# Patient Record
Sex: Female | Born: 1950 | Race: White | Hispanic: No | Marital: Single | Smoking: Never smoker
Health system: Southern US, Community
[De-identification: ages and names within clinical notes are randomized; demographics above are authoritative.]

## PROBLEM LIST (undated history)

## (undated) DIAGNOSIS — C50511 Malignant neoplasm of lower-outer quadrant of right female breast: Secondary | ICD-10-CM

## (undated) DIAGNOSIS — T8859XA Other complications of anesthesia, initial encounter: Secondary | ICD-10-CM

## (undated) DIAGNOSIS — M1990 Unspecified osteoarthritis, unspecified site: Secondary | ICD-10-CM

## (undated) DIAGNOSIS — T4145XA Adverse effect of unspecified anesthetic, initial encounter: Secondary | ICD-10-CM

## (undated) DIAGNOSIS — I10 Essential (primary) hypertension: Secondary | ICD-10-CM

## (undated) DIAGNOSIS — C801 Malignant (primary) neoplasm, unspecified: Secondary | ICD-10-CM

## (undated) HISTORY — PX: MASTECTOMY: SHX3

## (undated) HISTORY — PX: TONSILLECTOMY: SUR1361

## (undated) HISTORY — PX: CATARACT EXTRACTION: SUR2

## (undated) HISTORY — PX: EYE SURGERY: SHX253

## (undated) HISTORY — PX: HERNIA REPAIR: SHX51

## (undated) HISTORY — PX: FRACTURE SURGERY: SHX138

## (undated) HISTORY — PX: CHOLECYSTECTOMY: SHX55

---
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
## 2000-05-11 HISTORY — PX: BREAST BIOPSY: SHX20

## 2018-11-16 ENCOUNTER — Other Ambulatory Visit (HOSPITAL_COMMUNITY): Payer: Self-pay | Admitting: Surgery

## 2018-11-16 ENCOUNTER — Other Ambulatory Visit: Payer: Self-pay | Admitting: Surgery

## 2018-11-16 DIAGNOSIS — R1906 Epigastric swelling, mass or lump: Secondary | ICD-10-CM

## 2018-11-23 ENCOUNTER — Other Ambulatory Visit: Payer: Self-pay

## 2018-11-23 ENCOUNTER — Ambulatory Visit
Admission: RE | Admit: 2018-11-23 | Discharge: 2018-11-23 | Disposition: A | Discharge status: Home / Self Care | Payer: Medicare Other | Source: Ambulatory Visit | Attending: Surgery | Admitting: Surgery

## 2018-11-23 DIAGNOSIS — R1906 Epigastric swelling, mass or lump: Secondary | ICD-10-CM

## 2018-11-23 MED ORDER — IOHEXOL 300 MG/ML  SOLN
100.0000 mL | Freq: Once | INTRAMUSCULAR | Status: AC | PRN
  Administered 2018-11-23: 100 mL via INTRAVENOUS

## 2018-12-01 ENCOUNTER — Ambulatory Visit: Payer: Self-pay | Admitting: Surgery

## 2018-12-01 NOTE — H&P (Addendum)
Subjective:   CC: Epigastric mass [R19.06]  HPI:  Loretta Wong is a 68 y.o. female who was referred by James F Hedrick, MD for evaluation of above. First noticed it 1 year ago. Asymptomatic, but increasing in size.  Started as golf ball size, and now the size of football.  She thinks it fluctuates in size, but overall increasing in size.  Lump is not reducible. Patient has no symptoms of  difficulty urinating.    Past Medical History:  has no past medical history on file.  Past Surgical History:       Past Surgical History:  Procedure Laterality Date  . arm surgery    . CHOLECYSTECTOMY    . TONSILLECTOMY      Family History: family history includes Breast cancer in her mother; Colon cancer in her sister; High blood pressure (Hypertension) in her brother; Stomach cancer in her maternal grandfather.  Social History:  reports that she has never smoked. She has never used smokeless tobacco. She reports current alcohol use of about 1.0 standard drinks of alcohol per week. She reports that she does not use drugs.  Current Medications: currently has no medications in their medication list.  Allergies:       Allergies as of 11/16/2018 - Reviewed 11/16/2018  Allergen Reaction Noted  . Neomycin Rash 11/16/2018  . Sulfa (sulfonamide antibiotics) Vomiting 11/16/2018    ROS:  A 15 point review of systems was performed and pertinent positives and negatives noted in HPI   Objective:   BP (!) 181/88   Pulse 71   Ht 167.6 cm (5' 6")   Wt (!) 101.6 kg (224 lb)   BMI 36.15 kg/m   Constitutional :  alert, appears stated age, cooperative and no distress  Lymphatics/Throat:  no asymmetry, masses, or scars  Respiratory:  clear to auscultation bilaterally  Cardiovascular:  regular rate and rhythm  Gastrointestinal: soft, no guarding, non-tender.  Large right sided mass like structure in RUQ, measuring size of football, non-reducible.  somewhat firm, no overlying skin  changes..   Musculoskeletal: Steady gait and movement  Skin: Cool and moist, visible surgical scars lap chole  Psychiatric: Normal affect, non-agitated, not confused       LABS:  n/a   RADS: n/a Assessment:       Epigastric mass [R19.06]  Plan:   1. Epigastric mass [R19.06]    Patient has a history of a lap chole in the distant past, the size of this mass is very unusual for her to be an incisional hernia from a laparoscopic procedure.  Diastases recti is a differential diagnosis but this is off midline.  The timeframe of how this has enlarged so quickly within the past year is concerning for a very very remote possibility of some sort of neoplastic process.  Patient's report of fluctuating size is reassuring that this is not a neoplastic process.  We will proceed with a CT abdomen pelvis for more definitive diagnosis at this time.  Depending on the results, patient may need further referral to a subspecialist for further management.  Discussed reasoning above with patient and she understands and agreeable to plan.     Electronically signed by Donnette Macmullen, DO on 11/16/2018 9:56 AM     

## 2018-12-01 NOTE — H&P (View-Only) (Signed)
Subjective:   CC: Epigastric mass [R19.06]  HPI:  Loretta Wong is a 68 y.o. female who was referred by Lovie Macadamia, MD for evaluation of above. First noticed it 1 year ago. Asymptomatic, but increasing in size.  Started as golf ball size, and now the size of football.  She thinks it fluctuates in size, but overall increasing in size.  Lump is not reducible. Patient has no symptoms of  difficulty urinating.    Past Medical History:  has no past medical history on file.  Past Surgical History:       Past Surgical History:  Procedure Laterality Date  . arm surgery    . CHOLECYSTECTOMY    . TONSILLECTOMY      Family History: family history includes Breast cancer in her mother; Colon cancer in her sister; High blood pressure (Hypertension) in her brother; Stomach cancer in her maternal grandfather.  Social History:  reports that she has never smoked. She has never used smokeless tobacco. She reports current alcohol use of about 1.0 standard drinks of alcohol per week. She reports that she does not use drugs.  Current Medications: currently has no medications in their medication list.  Allergies:       Allergies as of 11/16/2018 - Reviewed 11/16/2018  Allergen Reaction Noted  . Neomycin Rash 11/16/2018  . Sulfa (sulfonamide antibiotics) Vomiting 11/16/2018    ROS:  A 15 point review of systems was performed and pertinent positives and negatives noted in HPI   Objective:   BP (!) 181/88   Pulse 71   Ht 167.6 cm (5\' 6" )   Wt (!) 101.6 kg (224 lb)   BMI 36.15 kg/m   Constitutional :  alert, appears stated age, cooperative and no distress  Lymphatics/Throat:  no asymmetry, masses, or scars  Respiratory:  clear to auscultation bilaterally  Cardiovascular:  regular rate and rhythm  Gastrointestinal: soft, no guarding, non-tender.  Large right sided mass like structure in RUQ, measuring size of football, non-reducible.  somewhat firm, no overlying skin  changes..   Musculoskeletal: Steady gait and movement  Skin: Cool and moist, visible surgical scars lap chole  Psychiatric: Normal affect, non-agitated, not confused       LABS:  n/a   RADS: n/a Assessment:       Epigastric mass [R19.06]  Plan:   1. Epigastric mass [R19.06]    Patient has a history of a lap chole in the distant past, the size of this mass is very unusual for her to be an incisional hernia from a laparoscopic procedure.  Diastases recti is a differential diagnosis but this is off midline.  The timeframe of how this has enlarged so quickly within the past year is concerning for a very very remote possibility of some sort of neoplastic process.  Patient's report of fluctuating size is reassuring that this is not a neoplastic process.  We will proceed with a CT abdomen pelvis for more definitive diagnosis at this time.  Depending on the results, patient may need further referral to a subspecialist for further management.  Discussed reasoning above with patient and she understands and agreeable to plan.     Electronically signed by Benjamine Sprague, DO on 11/16/2018 9:56 AM

## 2018-12-05 ENCOUNTER — Ambulatory Visit: Payer: Self-pay | Admitting: Surgery

## 2018-12-08 ENCOUNTER — Other Ambulatory Visit: Payer: Self-pay

## 2018-12-08 ENCOUNTER — Encounter
Admission: RE | Admit: 2018-12-08 | Discharge: 2018-12-08 | Disposition: A | Discharge status: Home / Self Care | Payer: Medicare Other | Source: Ambulatory Visit | Attending: Surgery | Admitting: Surgery

## 2018-12-08 HISTORY — DX: Essential (primary) hypertension: I10

## 2018-12-08 HISTORY — DX: Unspecified osteoarthritis, unspecified site: M19.90

## 2018-12-08 HISTORY — DX: Other complications of anesthesia, initial encounter: T88.59XA

## 2018-12-08 NOTE — Patient Instructions (Signed)
Your procedure is scheduled on: 12/15/18 Report to Day Surgery.[facility] SECOND FLOOR To find out your arrival time please call [phone] between 1PM - 3PM on 12/14/18  Remember: Instructions that are not followed completely may result in serious medical risk,  up to and including death, or upon the discretion of your surgeon and anesthesiologist your  surgery may need to be rescheduled.     _X__ 1. Do not eat food after midnight the night before your procedure.                 No gum chewing or hard candies. You may drink clear liquids up to 2 hours                 before you are scheduled to arrive for your surgery- DO not drink clear                 liquids within 2 hours of the start of your surgery.                 Clear Liquids include:  water, apple juice without pulp, clear carbohydrate                 drink such as Clearfast of Gatorade, Black Coffee or Tea (Do not add                 anything to coffee or tea).  __X__2.  On the morning of surgery brush your teeth with toothpaste and water, you                may rinse your mouth with mouthwash if you wish.  Do not swallow any toothpaste of mouthwash.     _X__ 3.  No Alcohol for 24 hours before or after surgery.   _X__ 4.  Do Not Smoke or use e-cigarettes For 24 Hours Prior to Your Surgery.                 Do not use any chewable tobacco products for at least 6 hours prior to                 surgery.  ____  5.  Bring all medications with you on the day of surgery if instructed.   _X__  6.  Notify your doctor if there is any change in your medical condition      (cold, fever, infections).     Do not wear jewelry, make-up, hairpins, clips or nail polish. Do not wear lotions, powders, or perfumes. You may wear deodorant. Do not shave 48 hours prior to surgery. Men may shave face and neck. Do not bring valuables to the hospital.    [facility] is not responsible for any belongings or  valuables.  Contacts, dentures or bridgework may not be worn into surgery. Leave your suitcase in the car. After surgery it may be brought to your room. For patients admitted to the hospital, discharge time is determined by your treatment team.   Patients discharged the day of surgery will not be allowed to drive home.   Please read over the following fact sheets that you were given:   Surgical Site Infection Prevention          ____ Take these medicines the morning of surgery with A SIP OF WATER:    1. NONE  2.   3.   4.  5.  6.  ____ Fleet Enema (as directed)   __X__  Use CHG Soap as directed  ____ Use inhalers on the day of surgery  ____ Stop metformin 2 days prior to surgery    ____ Take 1/2 of usual insulin dose the night before surgery. No insulin the morning          of surgery.   ____ Stop Coumadin/Plavix/aspirin on   ____ Stop Anti-inflammatories on    ____ Stop supplements until after surgery.    ____ Bring C-Pap to the hospital.

## 2018-12-12 ENCOUNTER — Encounter
Admission: RE | Admit: 2018-12-12 | Discharge: 2018-12-12 | Disposition: A | Discharge status: Home / Self Care | Payer: Medicare Other | Source: Ambulatory Visit | Attending: Surgery | Admitting: Surgery

## 2018-12-12 ENCOUNTER — Other Ambulatory Visit: Payer: Self-pay

## 2018-12-12 DIAGNOSIS — Z20828 Contact with and (suspected) exposure to other viral communicable diseases: Secondary | ICD-10-CM | POA: Diagnosis not present

## 2018-12-12 DIAGNOSIS — Z01818 Encounter for other preprocedural examination: Secondary | ICD-10-CM | POA: Insufficient documentation

## 2018-12-12 DIAGNOSIS — K430 Incisional hernia with obstruction, without gangrene: Secondary | ICD-10-CM | POA: Diagnosis not present

## 2018-12-12 DIAGNOSIS — Z01810 Encounter for preprocedural cardiovascular examination: Secondary | ICD-10-CM | POA: Diagnosis not present

## 2018-12-12 DIAGNOSIS — I498 Other specified cardiac arrhythmias: Secondary | ICD-10-CM | POA: Insufficient documentation

## 2018-12-12 DIAGNOSIS — R1906 Epigastric swelling, mass or lump: Secondary | ICD-10-CM | POA: Diagnosis present

## 2018-12-12 DIAGNOSIS — I10 Essential (primary) hypertension: Secondary | ICD-10-CM | POA: Insufficient documentation

## 2018-12-12 LAB — SARS CORONAVIRUS 2 (TAT 6-24 HRS): SARS Coronavirus 2: NEGATIVE

## 2018-12-15 ENCOUNTER — Encounter: Payer: Self-pay | Admitting: *Deleted

## 2018-12-15 ENCOUNTER — Observation Stay
Admission: RE | Admit: 2018-12-15 | Discharge: 2018-12-16 | Disposition: A | Discharge status: Home / Self Care | Payer: Medicare Other | Attending: Surgery | Admitting: Surgery

## 2018-12-15 ENCOUNTER — Ambulatory Visit: Payer: Medicare Other | Admitting: Certified Registered Nurse Anesthetist

## 2018-12-15 ENCOUNTER — Encounter
Admission: RE | Disposition: A | Discharge status: Home / Self Care | Payer: Medicare Other | Source: Home / Self Care | Attending: Surgery

## 2018-12-15 ENCOUNTER — Other Ambulatory Visit: Payer: Self-pay

## 2018-12-15 DIAGNOSIS — K430 Incisional hernia with obstruction, without gangrene: Secondary | ICD-10-CM | POA: Diagnosis not present

## 2018-12-15 DIAGNOSIS — K439 Ventral hernia without obstruction or gangrene: Secondary | ICD-10-CM | POA: Diagnosis present

## 2018-12-15 DIAGNOSIS — I10 Essential (primary) hypertension: Secondary | ICD-10-CM | POA: Insufficient documentation

## 2018-12-15 DIAGNOSIS — Z20828 Contact with and (suspected) exposure to other viral communicable diseases: Secondary | ICD-10-CM | POA: Insufficient documentation

## 2018-12-15 HISTORY — PX: VENTRAL HERNIA REPAIR: SHX424

## 2018-12-15 LAB — CREATININE, SERUM
Creatinine, Ser: 0.75 mg/dL (ref 0.44–1.00)
GFR calc Af Amer: >60 mL/min (ref >60)
GFR calc non Af Amer: >60 mL/min (ref >60)

## 2018-12-15 LAB — CBC
HCT: 45.8 % (ref 36.0–46.0)
Hemoglobin: 15.2 g/dL — ABNORMAL HIGH (ref 12.0–15.0)
MCH: 29.1 pg (ref 26.0–34.0)
MCHC: 33.2 g/dL (ref 30.0–36.0)
MCV: 87.7 fL (ref 80.0–100.0)
Platelets: 204 10*3/uL (ref 150–400)
RBC: 5.22 MIL/uL — ABNORMAL HIGH (ref 3.87–5.11)
RDW: 12.7 % (ref 11.5–15.5)
WBC: 18.7 10*3/uL — ABNORMAL HIGH (ref 4.0–10.5)
nRBC: 0 % (ref 0.0–0.2)

## 2018-12-15 SURGERY — REPAIR, HERNIA, VENTRAL
Anesthesia: General | Site: Abdomen

## 2018-12-15 MED ORDER — MAGNESIUM HYDROXIDE 400 MG/5ML PO SUSP: 30.0000 mL | Freq: Every day | ORAL | Status: DC | PRN

## 2018-12-15 MED ORDER — FAMOTIDINE 20 MG PO TABS
ORAL_TABLET | ORAL | Status: AC
  Administered 2018-12-15: 20 mg via ORAL
  Filled 2018-12-15: qty 1

## 2018-12-15 MED ORDER — SODIUM CHLORIDE 0.9 % IV SOLN
INTRAVENOUS | Status: DC | PRN
  Administered 2018-12-15: 12:00:00 90 mL

## 2018-12-15 MED ORDER — ONDANSETRON HCL 4 MG/2ML IJ SOLN
INTRAMUSCULAR | Status: AC
  Filled 2018-12-15: qty 2

## 2018-12-15 MED ORDER — CHLORHEXIDINE GLUCONATE CLOTH 2 % EX PADS
6.0000 | MEDICATED_PAD | Freq: Once | CUTANEOUS | Status: AC
  Administered 2018-12-15: 09:00:00 6 via TOPICAL

## 2018-12-15 MED ORDER — CEFAZOLIN SODIUM-DEXTROSE 2-4 GM/100ML-% IV SOLN
2.0000 g | INTRAVENOUS | Status: AC
  Administered 2018-12-15: 2 g via INTRAVENOUS

## 2018-12-15 MED ORDER — ROCURONIUM BROMIDE 50 MG/5ML IV SOLN
INTRAVENOUS | Status: AC
  Filled 2018-12-15: qty 1

## 2018-12-15 MED ORDER — GABAPENTIN 300 MG PO CAPS
300.0000 mg | ORAL_CAPSULE | ORAL | Status: AC
  Administered 2018-12-15: 300 mg via ORAL

## 2018-12-15 MED ORDER — OXYCODONE HCL 5 MG PO TABS
ORAL_TABLET | ORAL | Status: AC
  Filled 2018-12-15: qty 1

## 2018-12-15 MED ORDER — CELECOXIB 200 MG PO CAPS
200.0000 mg | ORAL_CAPSULE | ORAL | Status: AC
  Administered 2018-12-15: 200 mg via ORAL

## 2018-12-15 MED ORDER — ONDANSETRON HCL 4 MG/2ML IJ SOLN: 4.0000 mg | Freq: Four times a day (QID) | INTRAMUSCULAR | Status: DC | PRN

## 2018-12-15 MED ORDER — BUPIVACAINE LIPOSOME 1.3 % IJ SUSP
INTRAMUSCULAR | Status: AC
  Filled 2018-12-15: qty 20

## 2018-12-15 MED ORDER — DEXAMETHASONE SODIUM PHOSPHATE 10 MG/ML IJ SOLN
INTRAMUSCULAR | Status: AC
  Filled 2018-12-15: qty 1

## 2018-12-15 MED ORDER — IBUPROFEN 400 MG PO TABS: 600.0000 mg | ORAL_TABLET | Freq: Four times a day (QID) | ORAL | Status: DC | PRN

## 2018-12-15 MED ORDER — SUGAMMADEX SODIUM 200 MG/2ML IV SOLN
INTRAVENOUS | Status: AC
  Filled 2018-12-15: qty 2

## 2018-12-15 MED ORDER — FENTANYL CITRATE (PF) 100 MCG/2ML IJ SOLN
INTRAMUSCULAR | Status: AC
  Filled 2018-12-15: qty 2

## 2018-12-15 MED ORDER — BUPIVACAINE HCL (PF) 0.5 % IJ SOLN
INTRAMUSCULAR | Status: AC
  Filled 2018-12-15: qty 30

## 2018-12-15 MED ORDER — HYDROCODONE-ACETAMINOPHEN 5-325 MG PO TABS
1.0000 | ORAL_TABLET | ORAL | Status: DC | PRN
  Administered 2018-12-15 – 2018-12-16 (×3): 1 via ORAL
  Filled 2018-12-15 (×3): qty 1

## 2018-12-15 MED ORDER — ACETAMINOPHEN 500 MG PO TABS
1000.0000 mg | ORAL_TABLET | ORAL | Status: AC
  Administered 2018-12-15: 1000 mg via ORAL

## 2018-12-15 MED ORDER — GABAPENTIN 300 MG PO CAPS
ORAL_CAPSULE | ORAL | Status: AC
  Administered 2018-12-15: 300 mg via ORAL
  Filled 2018-12-15: qty 1

## 2018-12-15 MED ORDER — PROPOFOL 10 MG/ML IV BOLUS
INTRAVENOUS | Status: AC
  Filled 2018-12-15: qty 40

## 2018-12-15 MED ORDER — LIDOCAINE HCL (CARDIAC) PF 100 MG/5ML IV SOSY
PREFILLED_SYRINGE | INTRAVENOUS | Status: DC | PRN
  Administered 2018-12-15: 100 mg via INTRAVENOUS

## 2018-12-15 MED ORDER — GABAPENTIN 300 MG PO CAPS
300.0000 mg | ORAL_CAPSULE | Freq: Two times a day (BID) | ORAL | Status: DC
  Administered 2018-12-15 – 2018-12-16 (×2): 300 mg via ORAL
  Filled 2018-12-15 (×2): qty 1

## 2018-12-15 MED ORDER — FENTANYL CITRATE (PF) 100 MCG/2ML IJ SOLN
INTRAMUSCULAR | Status: DC | PRN
  Administered 2018-12-15 (×6): 50 ug via INTRAVENOUS

## 2018-12-15 MED ORDER — FENTANYL CITRATE (PF) 100 MCG/2ML IJ SOLN: 25.0000 ug | INTRAMUSCULAR | Status: DC | PRN

## 2018-12-15 MED ORDER — MIDAZOLAM HCL 2 MG/2ML IJ SOLN
INTRAMUSCULAR | Status: DC | PRN
  Administered 2018-12-15: 2 mg via INTRAVENOUS

## 2018-12-15 MED ORDER — LACTATED RINGERS IV SOLN
INTRAVENOUS | Status: DC
  Administered 2018-12-15 (×2): via INTRAVENOUS

## 2018-12-15 MED ORDER — SUCCINYLCHOLINE CHLORIDE 20 MG/ML IJ SOLN
INTRAMUSCULAR | Status: AC
  Filled 2018-12-15: qty 1

## 2018-12-15 MED ORDER — SODIUM CHLORIDE FLUSH 0.9 % IV SOLN
INTRAVENOUS | Status: AC
  Filled 2018-12-15: qty 20

## 2018-12-15 MED ORDER — KETOROLAC TROMETHAMINE 30 MG/ML IJ SOLN
INTRAMUSCULAR | Status: DC | PRN
  Administered 2018-12-15: 30 mg via INTRAVENOUS

## 2018-12-15 MED ORDER — ONDANSETRON HCL 4 MG/2ML IJ SOLN
INTRAMUSCULAR | Status: DC | PRN
  Administered 2018-12-15: 4 mg via INTRAVENOUS

## 2018-12-15 MED ORDER — CEFAZOLIN SODIUM-DEXTROSE 2-4 GM/100ML-% IV SOLN
INTRAVENOUS | Status: AC
  Filled 2018-12-15: qty 100

## 2018-12-15 MED ORDER — ROCURONIUM BROMIDE 100 MG/10ML IV SOLN
INTRAVENOUS | Status: DC | PRN
  Administered 2018-12-15: 45 mg via INTRAVENOUS
  Administered 2018-12-15: 5 mg via INTRAVENOUS
  Administered 2018-12-15: 20 mg via INTRAVENOUS

## 2018-12-15 MED ORDER — ACETAMINOPHEN 325 MG PO TABS: 650.0000 mg | ORAL_TABLET | Freq: Four times a day (QID) | ORAL | Status: DC | PRN

## 2018-12-15 MED ORDER — ACETAMINOPHEN 650 MG RE SUPP: 650.0000 mg | Freq: Four times a day (QID) | RECTAL | Status: DC | PRN

## 2018-12-15 MED ORDER — OXYCODONE HCL 5 MG PO TABS
5.0000 mg | ORAL_TABLET | Freq: Once | ORAL | Status: AC | PRN
  Administered 2018-12-15: 5 mg via ORAL

## 2018-12-15 MED ORDER — KETOROLAC TROMETHAMINE 30 MG/ML IJ SOLN
INTRAMUSCULAR | Status: AC
  Filled 2018-12-15: qty 1

## 2018-12-15 MED ORDER — ENOXAPARIN SODIUM 40 MG/0.4ML ~~LOC~~ SOLN
40.0000 mg | SUBCUTANEOUS | Status: DC
  Administered 2018-12-16: 40 mg via SUBCUTANEOUS
  Filled 2018-12-15: qty 0.4

## 2018-12-15 MED ORDER — DEXAMETHASONE SODIUM PHOSPHATE 10 MG/ML IJ SOLN
INTRAMUSCULAR | Status: DC | PRN
  Administered 2018-12-15: 10 mg via INTRAVENOUS

## 2018-12-15 MED ORDER — SODIUM CHLORIDE (PF) 0.9 % IJ SOLN
INTRAMUSCULAR | Status: AC
  Filled 2018-12-15: qty 100

## 2018-12-15 MED ORDER — CELECOXIB 200 MG PO CAPS
ORAL_CAPSULE | ORAL | Status: AC
  Administered 2018-12-15: 200 mg via ORAL
  Filled 2018-12-15: qty 1

## 2018-12-15 MED ORDER — BUPIVACAINE HCL 0.5 % IJ SOLN
INTRAMUSCULAR | Status: DC | PRN
  Administered 2018-12-15: 30 mL

## 2018-12-15 MED ORDER — MORPHINE SULFATE (PF) 2 MG/ML IV SOLN: 1.0000 mg | INTRAVENOUS | Status: DC | PRN

## 2018-12-15 MED ORDER — MIDAZOLAM HCL 2 MG/2ML IJ SOLN
INTRAMUSCULAR | Status: AC
  Filled 2018-12-15: qty 2

## 2018-12-15 MED ORDER — PHENYLEPHRINE HCL (PRESSORS) 10 MG/ML IV SOLN
INTRAVENOUS | Status: AC
  Filled 2018-12-15: qty 1

## 2018-12-15 MED ORDER — ACETAMINOPHEN 500 MG PO TABS
ORAL_TABLET | ORAL | Status: AC
  Administered 2018-12-15: 1000 mg via ORAL
  Filled 2018-12-15: qty 2

## 2018-12-15 MED ORDER — ONDANSETRON 4 MG PO TBDP: 4.0000 mg | ORAL_TABLET | Freq: Four times a day (QID) | ORAL | Status: DC | PRN

## 2018-12-15 MED ORDER — PROPOFOL 10 MG/ML IV BOLUS
INTRAVENOUS | Status: DC | PRN
  Administered 2018-12-15: 200 mg via INTRAVENOUS

## 2018-12-15 MED ORDER — OXYCODONE HCL 5 MG/5ML PO SOLN: 5.0000 mg | Freq: Once | ORAL | Status: AC | PRN

## 2018-12-15 MED ORDER — SUGAMMADEX SODIUM 200 MG/2ML IV SOLN
INTRAVENOUS | Status: DC | PRN
  Administered 2018-12-15: 203.2 mg via INTRAVENOUS

## 2018-12-15 MED ORDER — GLYCOPYRROLATE 0.2 MG/ML IJ SOLN
INTRAMUSCULAR | Status: AC
  Filled 2018-12-15: qty 1

## 2018-12-15 MED ORDER — FAMOTIDINE 20 MG PO TABS
20.0000 mg | ORAL_TABLET | Freq: Once | ORAL | Status: AC
  Administered 2018-12-15: 20 mg via ORAL

## 2018-12-15 MED ORDER — SUCCINYLCHOLINE CHLORIDE 20 MG/ML IJ SOLN
INTRAMUSCULAR | Status: DC | PRN
  Administered 2018-12-15: 100 mg via INTRAVENOUS

## 2018-12-15 MED ORDER — TRAMADOL HCL 50 MG PO TABS: 50.0000 mg | ORAL_TABLET | Freq: Four times a day (QID) | ORAL | Status: DC | PRN

## 2018-12-15 MED ORDER — LIDOCAINE HCL (PF) 2 % IJ SOLN
INTRAMUSCULAR | Status: AC
  Filled 2018-12-15: qty 10

## 2018-12-15 SURGICAL SUPPLY — 46 items
BLADE SURG 15 STRL LF DISP TIS (BLADE) ×1 IMPLANT
BLADE SURG 15 STRL SS (BLADE) ×1
BULB RESERV EVAC DRAIN JP 100C (MISCELLANEOUS) ×4 IMPLANT
CANISTER SUCT 1200ML W/VALVE (MISCELLANEOUS) ×2 IMPLANT
CHLORAPREP W/TINT 26 (MISCELLANEOUS) ×2 IMPLANT
COVER WAND RF STERILE (DRAPES) ×2 IMPLANT
DERMABOND ADVANCED (GAUZE/BANDAGES/DRESSINGS) ×1
DERMABOND ADVANCED .7 DNX12 (GAUZE/BANDAGES/DRESSINGS) ×1 IMPLANT
DRAIN CHANNEL JP 15F RND 16 (MISCELLANEOUS) ×4 IMPLANT
DRAPE LAPAROTOMY 100X77 ABD (DRAPES) ×2 IMPLANT
DRSG TEGADERM 8X12 (GAUZE/BANDAGES/DRESSINGS) ×2 IMPLANT
ELECT CAUTERY BLADE 6.4 (BLADE) ×2 IMPLANT
ELECT REM PT RETURN 9FT ADLT (ELECTROSURGICAL) ×2
ELECTRODE REM PT RTRN 9FT ADLT (ELECTROSURGICAL) ×1 IMPLANT
GAUZE SPONGE 4X4 12PLY STRL (GAUZE/BANDAGES/DRESSINGS) ×2 IMPLANT
GLOVE BIOGEL PI IND STRL 7.0 (GLOVE) ×1 IMPLANT
GLOVE BIOGEL PI INDICATOR 7.0 (GLOVE) ×1
GLOVE SURG SYN 6.5 ES PF (GLOVE) ×6 IMPLANT
GOWN STRL REUS W/ TWL LRG LVL3 (GOWN DISPOSABLE) ×1 IMPLANT
GOWN STRL REUS W/TWL LRG LVL3 (GOWN DISPOSABLE) ×1
KIT TURNOVER KIT A (KITS) ×2 IMPLANT
LABEL OR SOLS (LABEL) ×2 IMPLANT
MESH VENT ST 22.1X27.1CM OVL (Mesh General) ×2 IMPLANT
NEEDLE HYPO 22GX1.5 SAFETY (NEEDLE) ×4 IMPLANT
NS IRRIG 500ML POUR BTL (IV SOLUTION) ×2 IMPLANT
PACK BASIN MINOR ARMC (MISCELLANEOUS) ×2 IMPLANT
SPONGE KITTNER 5P (MISCELLANEOUS) ×2 IMPLANT
SPONGE LAP 18X18 RF (DISPOSABLE) ×2 IMPLANT
STAPLER SKIN PROX 35W (STAPLE) ×4 IMPLANT
SUT ETHIBOND NAB MO 7 #0 18IN (SUTURE) ×2 IMPLANT
SUT MNCRL 4-0 (SUTURE) ×1
SUT MNCRL 4-0 27XMFL (SUTURE) ×1
SUT PDS AB 0 CT1 27 (SUTURE) ×10 IMPLANT
SUT SILK 2 0 (SUTURE) ×1
SUT SILK 2 0SH CR/8 30 (SUTURE) ×2 IMPLANT
SUT SILK 2-0 18XBRD TIE 12 (SUTURE) ×1 IMPLANT
SUT SILK 3 0 (SUTURE) ×1
SUT SILK 3 0 SH 30 (SUTURE) ×4 IMPLANT
SUT SILK 3-0 18XBRD TIE 12 (SUTURE) ×1 IMPLANT
SUT VIC AB 3-0 SH 27 (SUTURE) ×4
SUT VIC AB 3-0 SH 27X BRD (SUTURE) ×4 IMPLANT
SUTURE MNCRL 4-0 27XMF (SUTURE) ×1 IMPLANT
SYR 10ML LL (SYRINGE) ×4 IMPLANT
SYR 20ML LL LF (SYRINGE) ×4 IMPLANT
SYR BULB IRRIG 60ML STRL (SYRINGE) ×2 IMPLANT
WATER STERILE IRR 1000ML POUR (IV SOLUTION) ×2 IMPLANT

## 2018-12-15 NOTE — Anesthesia Preprocedure Evaluation (Addendum)
Anesthesia Evaluation  Patient identified by MRN, date of birth, ID band Patient awake    Reviewed: Allergy & Precautions, H&P , NPO status , Patient's Chart, lab work & pertinent test results  History of Anesthesia Complications (+) history of anesthetic complications ("BP low after gallbladder surgery, admitted overnight")  Airway Mallampati: I  TM Distance: >3 FB Neck ROM: full  Mouth opening: Limited Mouth Opening  Dental  (+) Teeth Intact Bridges:   Pulmonary neg pulmonary ROS, neg COPD,           Cardiovascular hypertension, (-) angina(-) Past MI, (-) Cardiac Stents and (-) CABG negative cardio ROS  (-) dysrhythmias      Neuro/Psych negative neurological ROS  negative psych ROS   GI/Hepatic negative GI ROS, Neg liver ROS,   Endo/Other  negative endocrine ROS  Renal/GU      Musculoskeletal  (+) Arthritis ,   Abdominal   Peds  Hematology negative hematology ROS (+)   Anesthesia Other Findings Obese  Past Medical History: No date: Arthritis No date: Complication of anesthesia     Comment:  GB SURGERY BP DOWN/KEPT OVERNIGHT No date: Hypertension     Comment:  MONITORING  Past Surgical History: No date: CHOLECYSTECTOMY No date: FRACTURE SURGERY No date: TONSILLECTOMY     Reproductive/Obstetrics negative OB ROS                           Anesthesia Physical Anesthesia Plan  ASA: II  Anesthesia Plan: General ETT   Post-op Pain Management:    Induction:   PONV Risk Score and Plan: Ondansetron, Dexamethasone, Midazolam and Treatment may vary due to age or medical condition  Airway Management Planned:   Additional Equipment:   Intra-op Plan:   Post-operative Plan:   Informed Consent: I have reviewed the patients History and Physical, chart, labs and discussed the procedure including the risks, benefits and alternatives for the proposed anesthesia with the patient or  authorized representative who has indicated his/her understanding and acceptance.     Dental Advisory Given  Plan Discussed with: Anesthesiologist and CRNA  Anesthesia Plan Comments:       Anesthesia Quick Evaluation

## 2018-12-15 NOTE — Anesthesia Post-op Follow-up Note (Signed)
Anesthesia QCDR form completed.        

## 2018-12-15 NOTE — Interval H&P Note (Signed)
History and Physical Interval Note:  12/15/2018 9:00 AM  Loretta Wong  has presented today for surgery, with the diagnosis of K43.0 VENTRAL HERNIA W/O ABSTRUCCTION OR GANGRENE.  The various methods of treatment have been discussed with the patient and family. After consideration of risks, benefits and other options for treatment, the patient has consented to  Procedure(s): OPEN HERNIA REPAIR VENTRAL ADULT WITH MESH (N/A) as a surgical intervention.  The patient's history has been reviewed, patient examined, no change in status, stable for surgery.  I have reviewed the patient's chart and labs.  Questions were answered to the patient's satisfaction.    Discussed the risk of surgery including recurrence, which can be up to 50% in the case of incisional or complex hernias, possible use of prosthetic materials (mesh) and the increased risk of mesh infxn if used, bleeding, chronic pain, post-op infxn, post-op SBO or ileus, and possible re-operation to address said risks. The risks of general anesthetic, if used, includes MI, CVA, sudden death or even reaction to anesthetic medications also discussed. Alternatives include continued observation.  Benefits include possible symptom relief, prevention of incarceration, strangulation, enlargement in size over time, and the risk of emergency surgery in the face of strangulation.  Typical post-op recovery time of 3-5 days with 4-6 weeks of activity restrictions were also discussed.     Alexander Aument Lysle Pearl

## 2018-12-15 NOTE — Transfer of Care (Signed)
Immediate Anesthesia Transfer of Care Note  Patient: Loretta Wong  Procedure(s) Performed: OPEN HERNIA REPAIR VENTRAL ADULT WITH MESH (N/A Abdomen)  Patient Location: PACU  Anesthesia Type:General  Level of Consciousness: drowsy  Airway & Oxygen Therapy: Patient Spontanous Breathing and Patient connected to face mask oxygen  Post-op Assessment: Report given to RN and Post -op Vital signs reviewed and stable  Post vital signs: Reviewed and stable  Last Vitals:  Vitals Value Taken Time  BP 123/56   Temp    Pulse 69 12/15/18 1255  Resp 9 12/15/18 1255  SpO2 99 % 12/15/18 1255  Vitals shown include unvalidated device data.  Last Pain:  Vitals:   12/15/18 0816  TempSrc: Temporal  PainSc: 0-No pain         Complications: No apparent anesthesia complications

## 2018-12-15 NOTE — Plan of Care (Signed)

## 2018-12-15 NOTE — Anesthesia Postprocedure Evaluation (Signed)
Anesthesia Post Note  Patient: Loretta Wong  Procedure(s) Performed: OPEN HERNIA REPAIR VENTRAL ADULT WITH MESH (N/A Abdomen)  Patient location during evaluation: PACU Anesthesia Type: General Level of consciousness: awake and alert Pain management: pain level controlled Vital Signs Assessment: post-procedure vital signs reviewed and stable Respiratory status: spontaneous breathing and respiratory function stable Cardiovascular status: stable Anesthetic complications: no     Last Vitals:  Vitals:   12/15/18 1254 12/15/18 1309  BP: (!) 123/56 (!) 143/69  Pulse: 77 87  Resp: 13 (!) 21  Temp: 36.7 C   SpO2: 98% 99%    Last Pain:  Vitals:   12/15/18 1254  TempSrc:   PainSc: Asleep                 KEPHART,WILLIAM K

## 2018-12-15 NOTE — Anesthesia Procedure Notes (Signed)
Procedure Name: Intubation Performed by: Aaralynn Shepheard, CRNA Pre-anesthesia Checklist: Patient identified, Patient being monitored, Timeout performed, Emergency Drugs available and Suction available Patient Re-evaluated:Patient Re-evaluated prior to induction Oxygen Delivery Method: Circle system utilized Preoxygenation: Pre-oxygenation with 100% oxygen Induction Type: IV induction Ventilation: Mask ventilation without difficulty Laryngoscope Size: 3 and McGraph Grade View: Grade I Tube type: Oral Tube size: 7.0 mm Number of attempts: 1 Airway Equipment and Method: Stylet Placement Confirmation: ETT inserted through vocal cords under direct vision,  positive ETCO2 and breath sounds checked- equal and bilateral Secured at: 22 cm Tube secured with: Tape Dental Injury: Teeth and Oropharynx as per pre-operative assessment        

## 2018-12-16 DIAGNOSIS — K430 Incisional hernia with obstruction, without gangrene: Secondary | ICD-10-CM | POA: Diagnosis not present

## 2018-12-16 LAB — BASIC METABOLIC PANEL
Anion gap: 8 (ref 5–15)
BUN: 14 mg/dL (ref 8–23)
CO2: 28 mmol/L (ref 22–32)
Calcium: 9.3 mg/dL (ref 8.9–10.3)
Chloride: 104 mmol/L (ref 98–111)
Creatinine, Ser: 0.84 mg/dL (ref 0.44–1.00)
GFR calc Af Amer: >60 mL/min (ref >60)
GFR calc non Af Amer: >60 mL/min (ref >60)
Glucose, Bld: 125 mg/dL — ABNORMAL HIGH (ref 70–99)
Potassium: 5.1 mmol/L (ref 3.5–5.1)
Sodium: 140 mmol/L (ref 135–145)

## 2018-12-16 LAB — CBC
HCT: 44.3 % (ref 36.0–46.0)
Hemoglobin: 14.2 g/dL (ref 12.0–15.0)
MCH: 28.7 pg (ref 26.0–34.0)
MCHC: 32.1 g/dL (ref 30.0–36.0)
MCV: 89.7 fL (ref 80.0–100.0)
Platelets: 220 10*3/uL (ref 150–400)
RBC: 4.94 MIL/uL (ref 3.87–5.11)
RDW: 12.8 % (ref 11.5–15.5)
WBC: 15.6 10*3/uL — ABNORMAL HIGH (ref 4.0–10.5)
nRBC: 0 % (ref 0.0–0.2)

## 2018-12-16 MED ORDER — IBUPROFEN 800 MG PO TABS: 800.0000 mg | ORAL_TABLET | Freq: Three times a day (TID) | ORAL | 0 refills | Status: AC | PRN

## 2018-12-16 MED ORDER — DOCUSATE SODIUM 100 MG PO CAPS: 100.0000 mg | ORAL_CAPSULE | Freq: Two times a day (BID) | ORAL | 0 refills | Status: AC | PRN

## 2018-12-16 MED ORDER — ACETAMINOPHEN 325 MG PO TABS: 650.0000 mg | ORAL_TABLET | Freq: Three times a day (TID) | ORAL | 0 refills | Status: AC | PRN

## 2018-12-16 MED ORDER — HYDROCODONE-ACETAMINOPHEN 5-325 MG PO TABS: 1.0000 | ORAL_TABLET | Freq: Four times a day (QID) | ORAL | 0 refills | Status: AC | PRN

## 2018-12-16 NOTE — Discharge Summary (Signed)
Physician Discharge Summary  Patient ID: Loretta Wong MRN: [id number] DOB/AGE: 1950-08-17 68 y.o.  Admit date: 12/15/2018 Discharge date: 12/16/2018  Admission Diagnoses: Ventral hernia nonobstructed non-strangulated  Discharge Diagnoses:  Same as above  Discharged Condition: good  Hospital Course: Admitted for elective repair of above.  Please see op note for details.  Overnight observation without any acute issues.  Pain was controlled and diet advance as tolerated.  JP drain with as expected amount of serosanguineous drainage.  Consults: None  Discharge Exam: Blood pressure (!) 134/58, pulse 64, temperature 98.4 F (36.9 C), temperature source Oral, resp. rate 16, height 5\' 6"  (1.676 m), weight 102 kg, SpO2 95 %. General appearance: alert, cooperative and no distress GI: soft, non-tender; bowel sounds normal; no masses,  no organomegaly midline incision intact.  JP drains with serosanguineous fluid from both sides.  Total of 122mLs since operation.  Disposition:  Discharge disposition: 01-Home or Self Care       Discharge Instructions    Discharge patient   Complete by: As directed    Discharge disposition: 01-Home or Self Care   Discharge patient date: 12/16/2018     Allergies as of 12/16/2018      Reactions   Sulfa Antibiotics Nausea And Vomiting, Other (See Comments)   Headaches, body aches   Neomycin Rash      Medication List    TAKE these medications   acetaminophen 325 MG tablet Commonly known as: Tylenol Take 2 tablets (650 mg total) by mouth every 8 (eight) hours as needed for mild pain.   cetirizine 10 MG tablet Commonly known as: ZYRTEC Take 10 mg by mouth daily as needed for allergies.   docusate sodium 100 MG capsule Commonly known as: Colace Take 1 capsule (100 mg total) by mouth 2 (two) times daily as needed for up to 10 days for mild constipation.   HYDROcodone-acetaminophen 5-325 MG tablet Commonly known as: Norco Take 1 tablet by mouth every  6 (six) hours as needed for up to 3 days for moderate pain.   ibuprofen 800 MG tablet Commonly known as: ADVIL Take 1 tablet (800 mg total) by mouth every 8 (eight) hours as needed for mild pain or moderate pain.   pseudoephedrine 30 MG tablet Commonly known as: SUDAFED Take 60 mg by mouth every 4 (four) hours as needed for congestion.      Follow-up Information    [place], Adilen Pavelko, DO Follow up.   Specialty: Surgery Why: keep appt already made Contact information: [place]  [id number] [phone]            Total time spent arranging discharge was >58min. Signed: Benjamine Sprague 12/16/2018, 1:55 PM

## 2018-12-16 NOTE — Discharge Instructions (Signed)
Hernia repair, Care After This sheet gives you information about how to care for yourself after your procedure. Your health care provider may also give you more specific instructions. If you have problems or questions, contact your health care provider. What can I expect after the procedure? After your procedure, it is common to have the following:  Pain in your abdomen, especially in the incision areas. You will be given medicine to control the pain.  Tiredness. This is a normal part of the recovery process. Your energy level will return to normal over the next several weeks.  Changes in your bowel movements, such as constipation or needing to go more often. Talk with your health care provider about how to manage this. Follow these instructions at home: Medicines   tylenol and advil as needed for discomfort.  Please alternate between the two every four hours as needed for pain.     Use narcotics, if prescribed, only when tylenol and motrin is not enough to control pain.   325-650mg  every 8hrs to max of 3000mg /24hrs (including the 325mg  in every norco dose) for the tylenol.     Advil up to 800mg  per dose every 8hrs as needed for pain.    PLEASE RECORD NUMBER OF PILLS TAKEN UNTIL NEXT FOLLOW UP APPT.  THIS WILL HELP DETERMINE HOW READY YOU ARE TO BE RELEASED FROM ANY ACTIVITY RESTRICTIONS  Do not drive or use heavy machinery while taking prescription pain medicine.  Do not drink alcohol while taking prescription pain medicine.  Incision care     Follow instructions from your health care provider about how to take care of your incision areas. Make sure you: ? Keep your incisions clean and dry. ? Wash your hands with soap and water before and after applying medicine to the areas, and before and after changing your bandage (dressing). If soap and water are not available, use hand sanitizer. ? Change your dressing as told by your health care provider. ? Leave stitches (sutures), skin  glue, or adhesive strips in place. These skin closures may need to stay in place for 2 weeks or longer. If adhesive strip edges start to loosen and curl up, you may trim the loose edges. Do not remove adhesive strips completely unless your health care provider tells you to do that.  Do not wear tight clothing over the incisions. Tight clothing may rub and irritate the incision areas, which may cause the incisions to open.  PLEASE REMOVE DRESSING DAY AFTER DISCHARGE.  OK TO LEAVE STAPLES EXPOSED OR KEEP IT COVERED AND CHANGE DRESSING DAILY  RECORD OUTPUT DAILY FROM YOUR DRAINS.  CONTINUE DRAIN CARE AS EDUCATED PRIOR TO DISCHARGE  Do not take baths, swim, or use a hot tub until your health care provider approves. OK TO SHOWER IN 24HRS.    Check your incision area every day for signs of infection. Check for: ? More redness, swelling, or pain. ? More fluid or blood. ? Warmth. ? Pus or a bad smell. Activity  Avoid lifting anything that is heavier than 10 lb (4.5 kg) for 2 weeks or until your health care provider says it is okay.  No pushing/pulling greater than 30lbs  You may resume normal activities as told by your health care provider. Ask your health care provider what activities are safe for you.  Take rest breaks during the day as needed. Eating and drinking  Follow instructions from your health care provider about what you can eat after surgery.  To prevent  or treat constipation while you are taking prescription pain medicine, your health care provider may recommend that you: ? Drink enough fluid to keep your urine clear or pale yellow. ? Take over-the-counter or prescription medicines. ? Eat foods that are high in fiber, such as fresh fruits and vegetables, whole grains, and beans. ? Limit foods that are high in fat and processed sugars, such as fried and sweet foods. General instructions  Ask your health care provider when you will need an appointment to get your sutures or  staples removed.  Keep all follow-up visits as told by your health care provider. This is important. Contact a health care provider if:  You have more redness, swelling, or pain around your incisions.  You have more fluid or blood coming from the incisions.  Your incisions feel warm to the touch.  You have pus or a bad smell coming from your incisions or your dressing.  You have a fever.  You have an incision that breaks open (edges not staying together) after sutures or staples have been removed. Get help right away if:  You develop a rash.  You have chest pain or difficulty breathing.  You have pain or swelling in your legs.  You feel light-headed or you faint.  Your abdomen swells (becomes distended).  You have nausea or vomiting.  You have blood in your stool (feces). This information is not intended to replace advice given to you by your health care provider. Make sure you discuss any questions you have with your health care provider. Document Released: 11/14/2004 Document Revised: 01/14/2018 Document Reviewed: 01/27/2016 Elsevier Interactive Patient Education  2019 [employer].

## 2018-12-16 NOTE — Care Management CC44 (Signed)
Condition Code 44 Documentation Completed  Patient Details  Name: Romina Divirgilio MRN: [id number] Date of Birth: 1950/07/08   Condition Code 44 given:  Yes Patient signature on Condition Code 44 notice:  Yes Documentation of 2 MD's agreement:  Yes Code 44 added to claim:  Yes    Candie Chroman, LCSW 12/16/2018, 2:17 PM

## 2018-12-16 NOTE — Op Note (Addendum)
Preoperative diagnosis: Incisional hernia, incarcerated postoperative diagnosis: same  Procedure: Open incisional hernia repair with mesh, bilateral myofascial release and flap creation  Anesthesia: LMA  Surgeon: Benjamine Sprague Assistant: Caroleen Hamman, MD  Wound Classification: Clean  Specimen: Hernia sac  Complications: None  Estimated Blood Loss: minimal  Indications:see HPI  Findings: 1. 6.5cm x 7cm epigastric incisional  Hernia, incarcerated 2. Tension free repair achieved with suture after retrorectus dissection and mesh placement 3. Adequate hemostasis   Description of procedure: The patient was brought to the operating room and general anesthesia was induced. A time-out was completed verifying correct patient, procedure, site, positioning, and implant(s) and/or special equipment prior to beginning this procedure. Antibiotics were administered prior to making the incision. SCDs and Foley catheter placed. The anterior abdominal wall was prepped and draped in the standard sterile fashion.   An incision was made over the palpable hernia and dissection carried down to the hernia sac itself, which was carefully dissected off the surrounding tissue down to the fascial layer.  Afterwards the hernia sac itself was transected, noting viable colon and omentum within this sac.  All of the contents were then reduced back into the abdominal cavity prior to removing the excess hernia sac.  The sac itself was then sent off the operative field pending pathology.  Once the fascial defect edges were cleared off, the defect itself was measured at 6.5 cm x 7 cm.  Incision was then made in the posterior fascia, and dissection was carried in a retrorectus fashion towards the lateral aspect to create myocutaneous flaps in order to facilitate closure over the defect.  This was performed in identical fashion bilaterally.    After dissection was carried out in order to accommodate the planned mesh as well as to  create enough laxity to close the defect at the midline, the peritoneal lining was also separated from the posterior rectus sheath in order to provide additional coverage over the defect.  Once the peritoneum was freed and able to be approximated at the midline with no tension, 0 PDS x2  was used close the peritoneal lining.  The posterior rectus sheath was then closed over this in a similar fashion using 0 PDS x2.  Bard ventral light ST mesh measuring 30 cm x 24 cm was placed on top of the posterior rectus sheath and deep to the rectus muscles within the center of the former defect site.  Anterior fascia was then closed over the mesh, after placing a Blake drain atop the mesh.  A second drain was then placed in subcutaneous layer after the anterior fascia was closed using 0 PDS x2.  Wound was extensively irrigated before having wound closure, and hemostasis was confirmed as well.  Exparel was then infused surrounding the mesh and along the incision line.  Skin closed in a multilayer fashion, using 3-0 Vicryl for the deep dermal layer in an interrupted fashion and staples for the skin.  3-0 silk was then used to secure the drains to the abdominal wall.  Wound was then dressed with 4 x 4 and Tegaderm, with drain insertion site covered with 4 x 4 and Tegaderm as well.  Patient was then successfully awakened and transferred to PACU in stable condition.  At the end of the procedure sponge and instrument counts were correct

## 2018-12-20 LAB — SURGICAL PATHOLOGY

## 2019-09-22 ENCOUNTER — Other Ambulatory Visit: Payer: Self-pay | Admitting: Family Medicine

## 2019-11-01 ENCOUNTER — Other Ambulatory Visit: Payer: Self-pay | Admitting: Certified Nurse Midwife

## 2019-11-01 ENCOUNTER — Ambulatory Visit
Admission: RE | Admit: 2019-11-01 | Discharge: 2019-11-01 | Disposition: A | Discharge status: Home / Self Care | Payer: Medicare Other | Source: Ambulatory Visit | Attending: Certified Nurse Midwife | Admitting: Certified Nurse Midwife

## 2019-11-01 DIAGNOSIS — Z1231 Encounter for screening mammogram for malignant neoplasm of breast: Secondary | ICD-10-CM | POA: Insufficient documentation

## 2019-11-09 ENCOUNTER — Other Ambulatory Visit: Payer: Self-pay | Admitting: Certified Nurse Midwife

## 2019-11-09 DIAGNOSIS — R928 Other abnormal and inconclusive findings on diagnostic imaging of breast: Secondary | ICD-10-CM

## 2019-11-09 DIAGNOSIS — N6310 Unspecified lump in the right breast, unspecified quadrant: Secondary | ICD-10-CM

## 2019-11-20 ENCOUNTER — Ambulatory Visit
Admission: RE | Admit: 2019-11-20 | Discharge: 2019-11-20 | Disposition: A | Discharge status: Home / Self Care | Payer: Medicare Other | Source: Ambulatory Visit | Attending: Certified Nurse Midwife | Admitting: Certified Nurse Midwife

## 2019-11-20 DIAGNOSIS — N6310 Unspecified lump in the right breast, unspecified quadrant: Secondary | ICD-10-CM

## 2019-11-20 DIAGNOSIS — R928 Other abnormal and inconclusive findings on diagnostic imaging of breast: Secondary | ICD-10-CM

## 2019-11-22 ENCOUNTER — Other Ambulatory Visit: Payer: Self-pay | Admitting: Certified Nurse Midwife

## 2019-11-22 DIAGNOSIS — N6310 Unspecified lump in the right breast, unspecified quadrant: Secondary | ICD-10-CM

## 2019-11-22 DIAGNOSIS — R928 Other abnormal and inconclusive findings on diagnostic imaging of breast: Secondary | ICD-10-CM

## 2019-11-24 ENCOUNTER — Ambulatory Visit
Admission: RE | Admit: 2019-11-24 | Discharge: 2019-11-24 | Disposition: A | Discharge status: Home / Self Care | Payer: Medicare Other | Source: Ambulatory Visit | Attending: Certified Nurse Midwife | Admitting: Certified Nurse Midwife

## 2019-11-24 DIAGNOSIS — R928 Other abnormal and inconclusive findings on diagnostic imaging of breast: Secondary | ICD-10-CM | POA: Diagnosis not present

## 2019-11-24 DIAGNOSIS — N6310 Unspecified lump in the right breast, unspecified quadrant: Secondary | ICD-10-CM | POA: Diagnosis present

## 2019-11-30 LAB — SURGICAL PATHOLOGY

## 2019-12-21 DIAGNOSIS — C50511 Malignant neoplasm of lower-outer quadrant of right female breast: Secondary | ICD-10-CM | POA: Insufficient documentation

## 2020-03-15 DIAGNOSIS — Z1379 Encounter for other screening for genetic and chromosomal anomalies: Secondary | ICD-10-CM | POA: Insufficient documentation

## 2020-10-21 DIAGNOSIS — Z5181 Encounter for therapeutic drug level monitoring: Secondary | ICD-10-CM | POA: Insufficient documentation

## 2021-01-31 IMAGING — US US BREAST*R* LIMITED INC AXILLA
1 series · 13 of 15 positions shown · non-contrast
Comparison: Previous exam(s).

CLINICAL DATA: 69-year-old patient recalled from recent screening
mammogram for evaluation of a possible right breast mass. Prior to
5450. The patient reports a prior benign biopsy in the right breast
(the biopsy clip is in the posterior third of the upper inner right
breast).

EXAM:
DIGITAL DIAGNOSTIC RIGHT MAMMOGRAM WITH TOMO
ULTRASOUND RIGHT BREAST

[Series 1: us breast*right* limited inc axilla · 0.05mm/px · 13 of 15 slices shown]
[im 1/15]
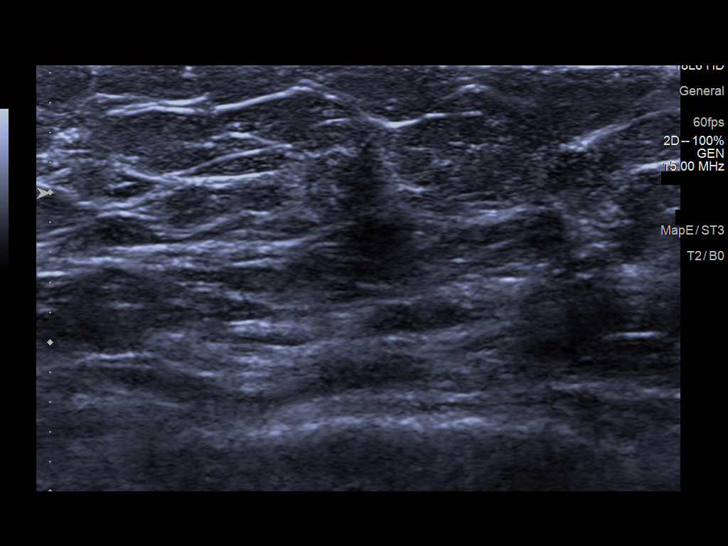
[im 2/15]
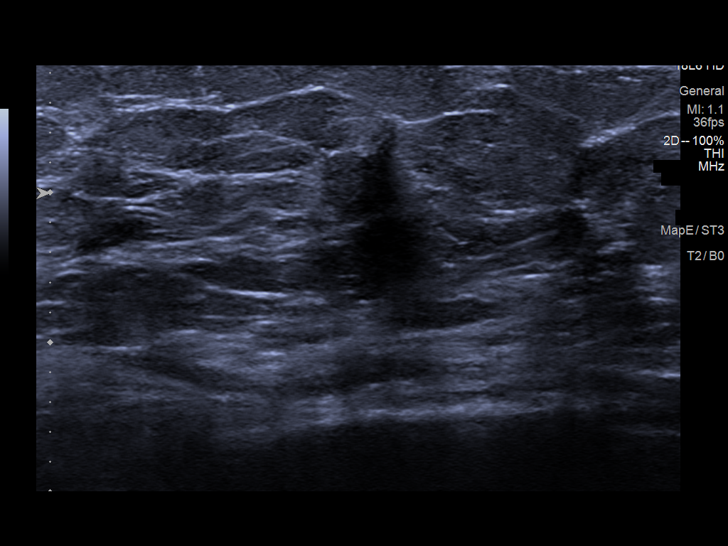
[im 3/15]
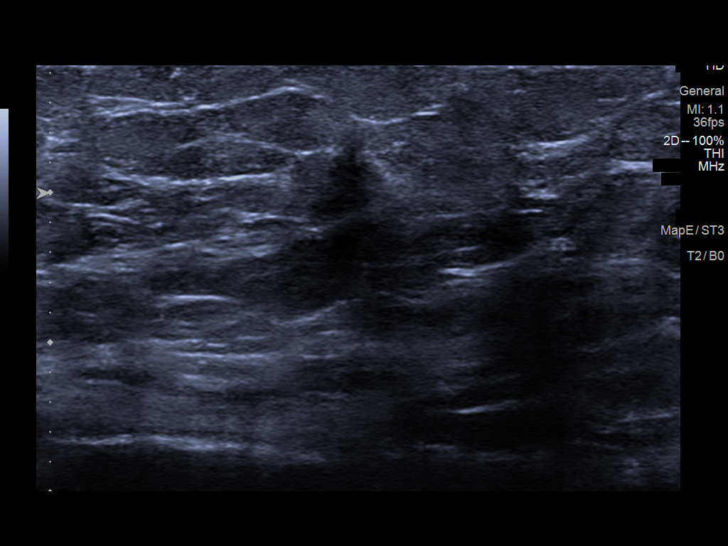
[im 5/15]
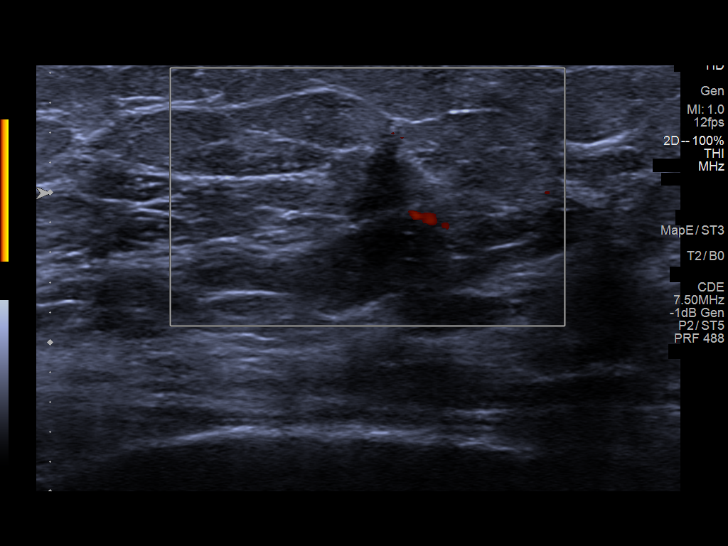
[im 6/15]
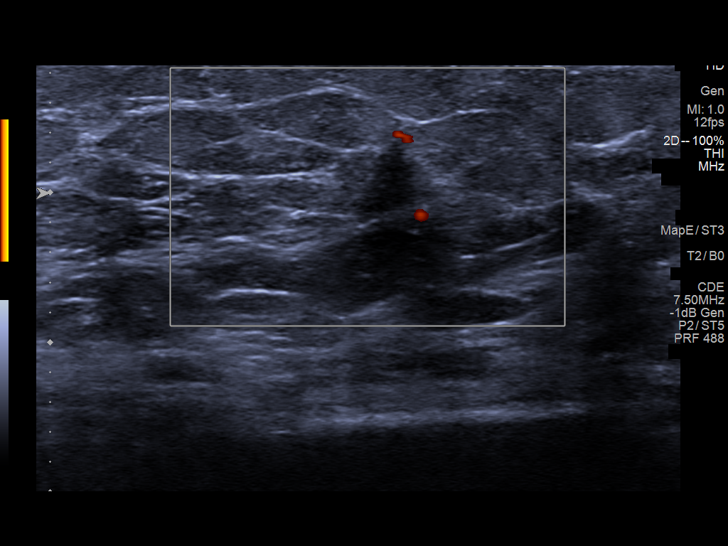
[im 7/15]
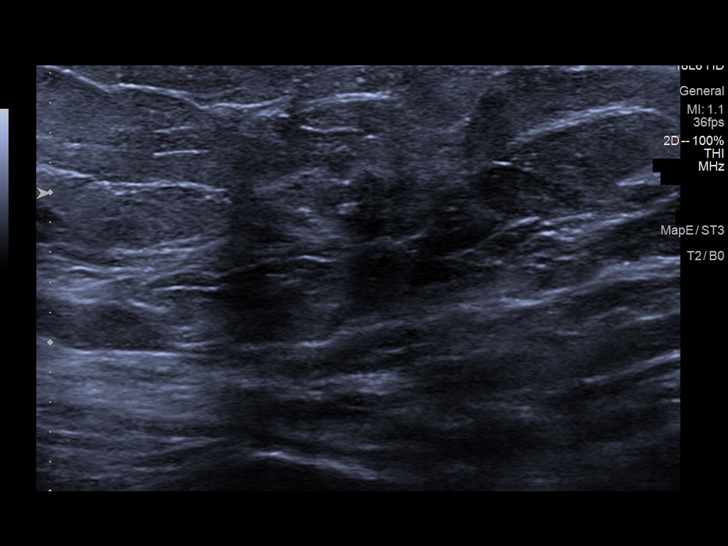
[im 8/15]
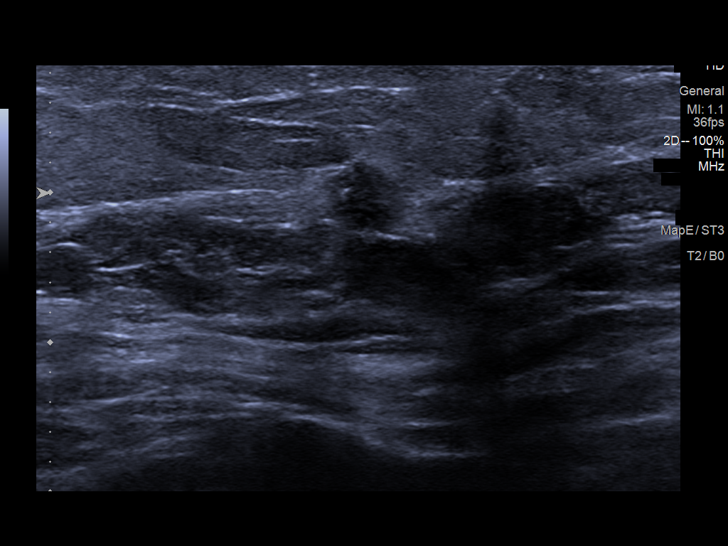
[im 9/15]
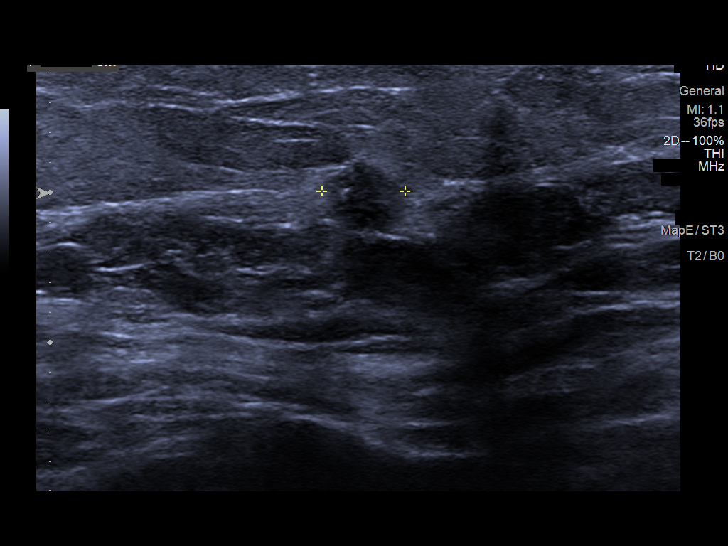
[im 10/15]
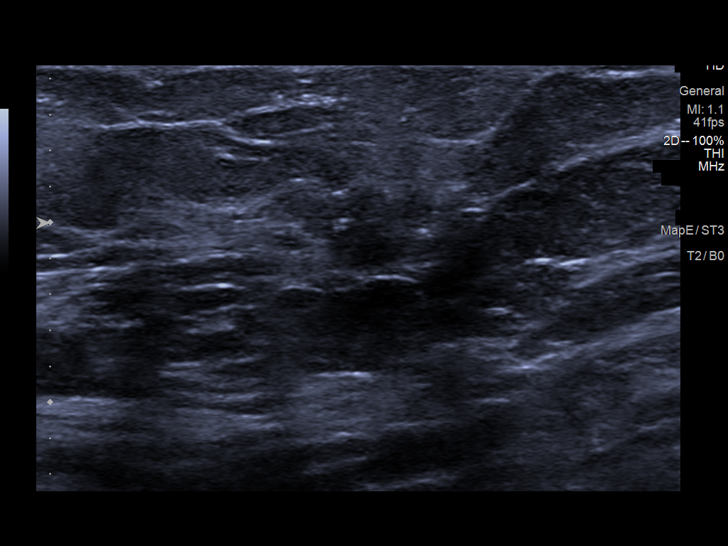
[im 11/15]
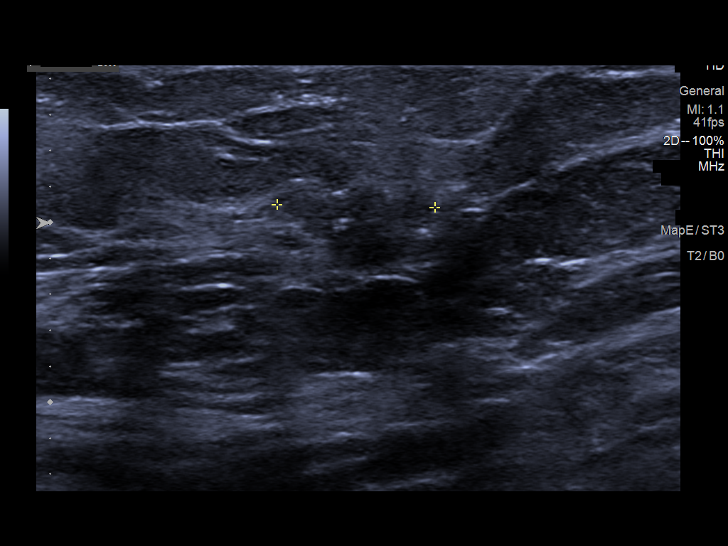
[im 13/15]
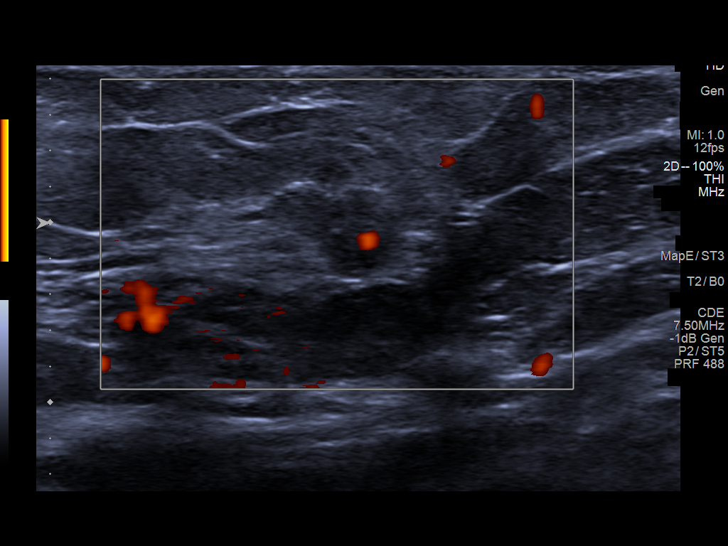
[im 14/15]
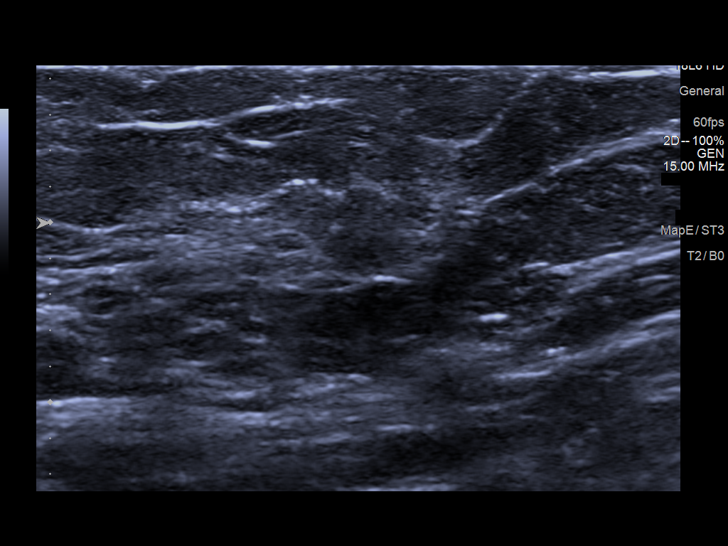
[im 15/15]
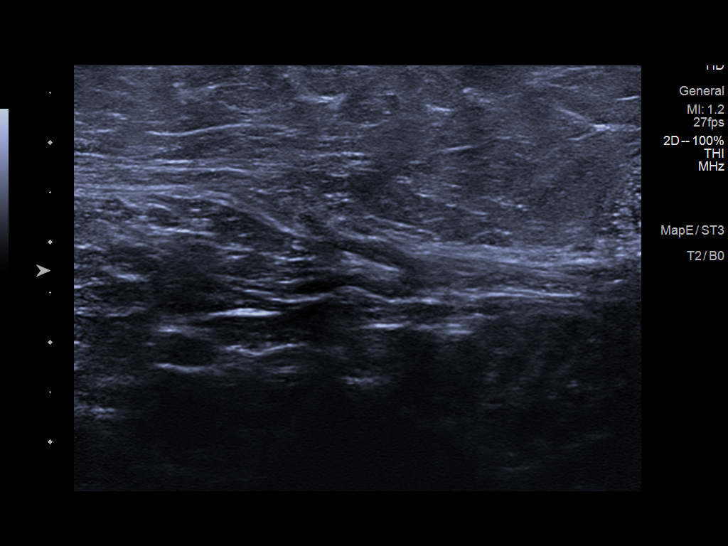

[13 of 15 positions shown; findings below may reference images not displayed]

ACR Breast Density Category b: There are scattered areas of
fibroglandular density.
FINDINGS: There is an irregular mass with internal and adjacent punctate
calcifications. The mass and calcifications together measure 1.2 x
1.9 x 1.2 cm.

On physical exam, question subtle focal thickening 8 o'clock
position right breast approximately 3-4 cm from the nipple.

Targeted ultrasound is performed, showing an irregular hypoechoic
mass with indistinct margins and internal echogenicities at 8
o'clock position 3-4 cm from the nipple. Mass measures 0.9 x 0.5 x
0.6 cm. Internal vascularity is seen.

Survey of the right axilla is negative for lymphadenopathy.
IMPRESSION: Suspicious mass with associated internal and adjacent
microcalcifications in the lower outer quadrant of the right breast.

RECOMMENDATION:
Ultrasound-guided core needle biopsy of the right breast mass is
recommended. If the mass is malignant requires excision, it is
recommended that the adjacent microcalcifications be excised with
the mass.

I have discussed the findings and recommendations with the patient.
If applicable, a reminder letter will be sent to the patient
regarding the next appointment.

BI-RADS CATEGORY  5: Highly suggestive of malignancy.

## 2021-01-31 IMAGING — MG MM DIGITAL DIAGNOSTIC UNILAT*R* W/ TOMO W/ CAD
6 of 9 series · 6 of 21 positions shown · non-contrast
Comparison: Previous exam(s).

CLINICAL DATA: 69-year-old patient recalled from recent screening
mammogram for evaluation of a possible right breast mass. Prior to
5450. The patient reports a prior benign biopsy in the right breast
(the biopsy clip is in the posterior third of the upper inner right
breast).

EXAM:
DIGITAL DIAGNOSTIC RIGHT MAMMOGRAM WITH TOMO
ULTRASOUND RIGHT BREAST

[R CC (1 of 2)]
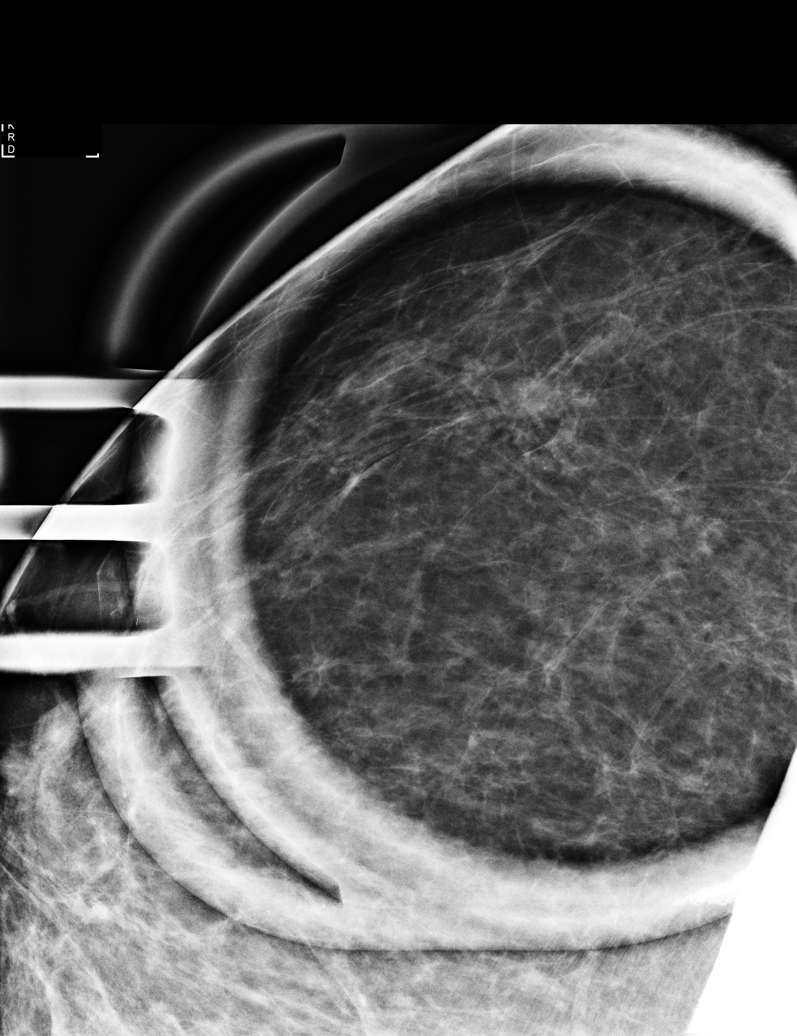

[R CC (2 of 2)]
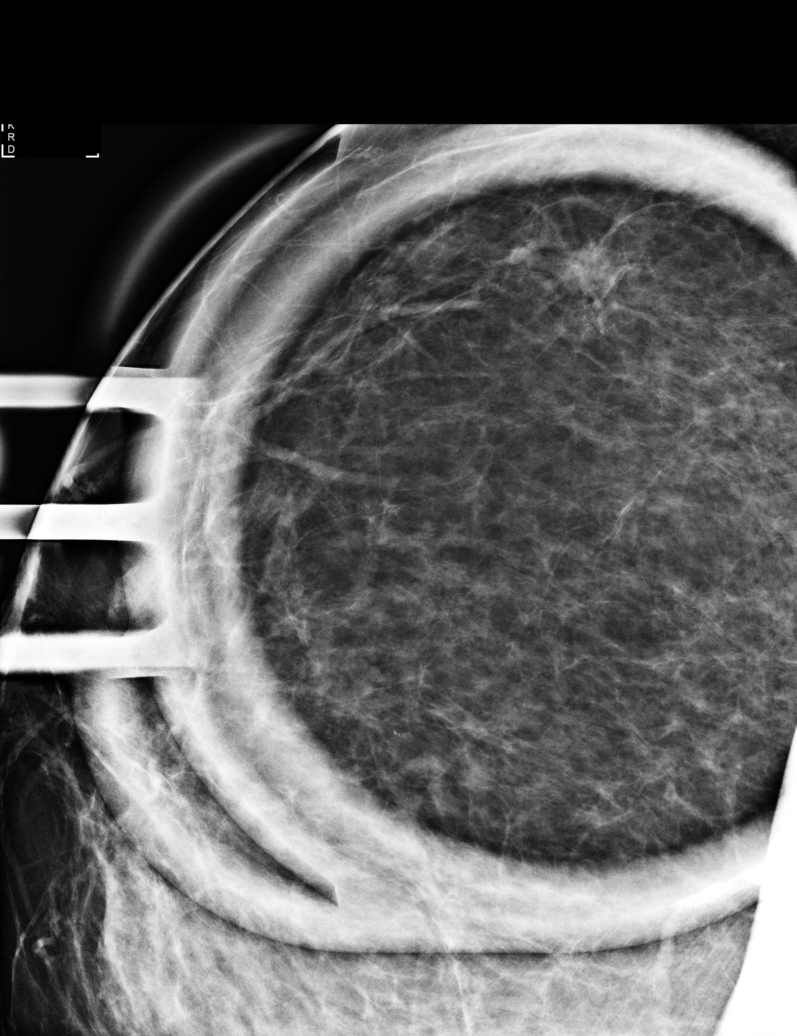

[R ML]
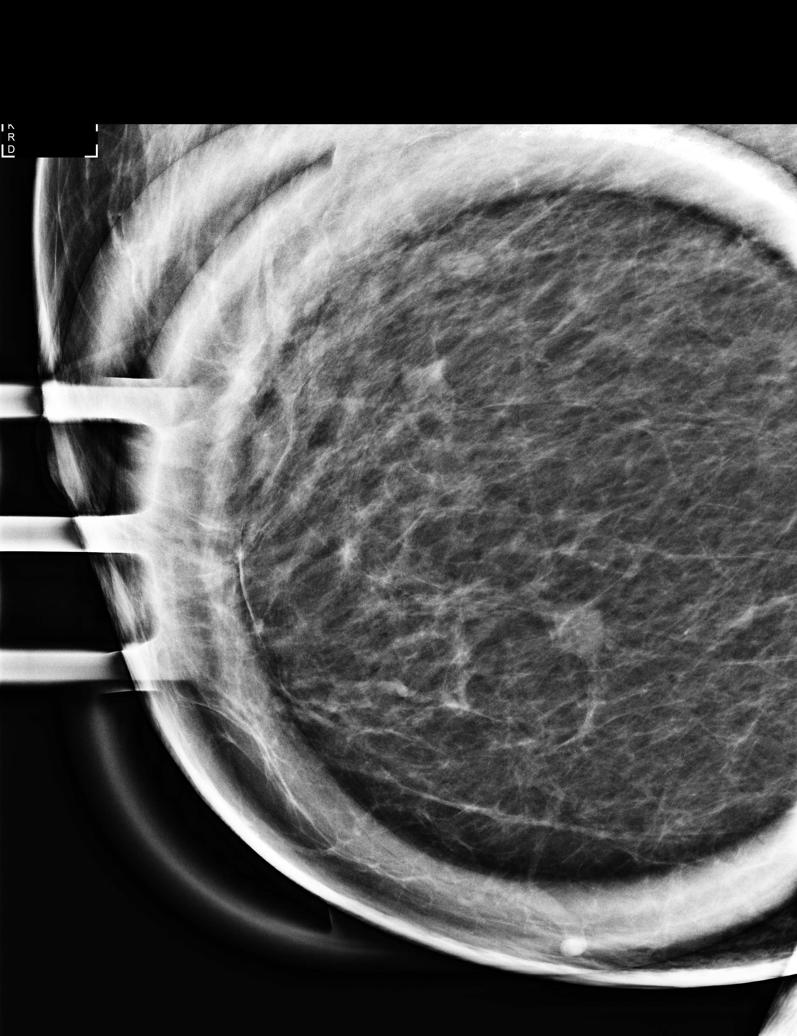

[R ML synth-2D (1 of 2)]
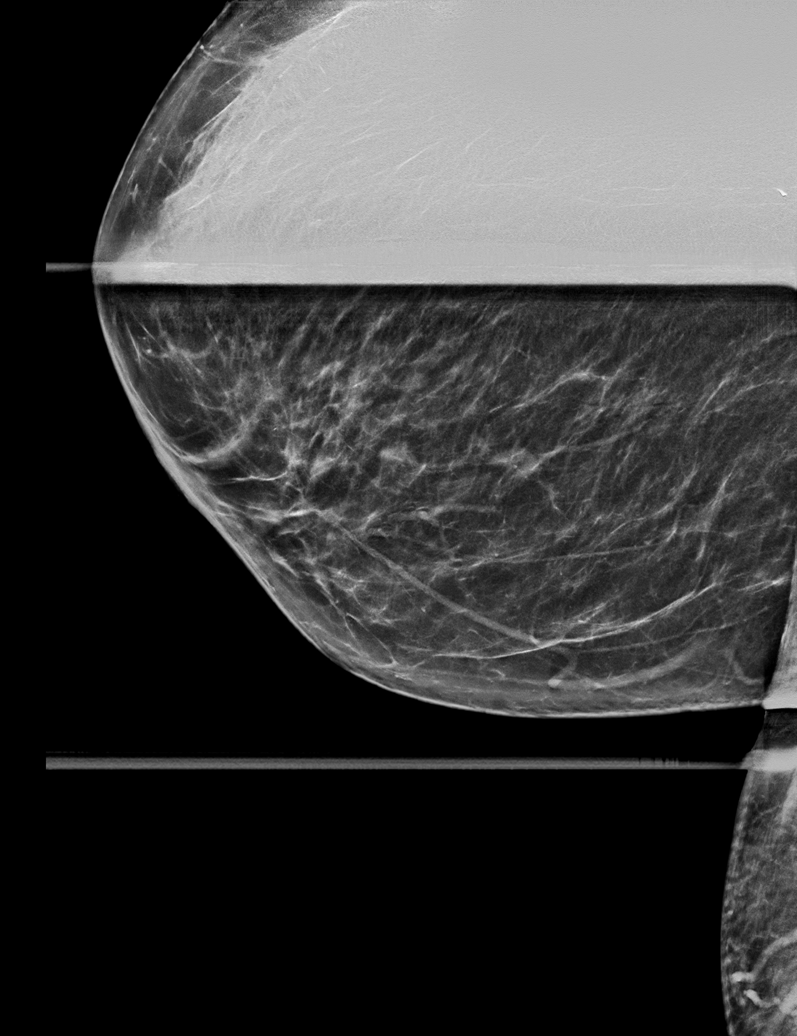

[R ML synth-2D (2 of 2)]
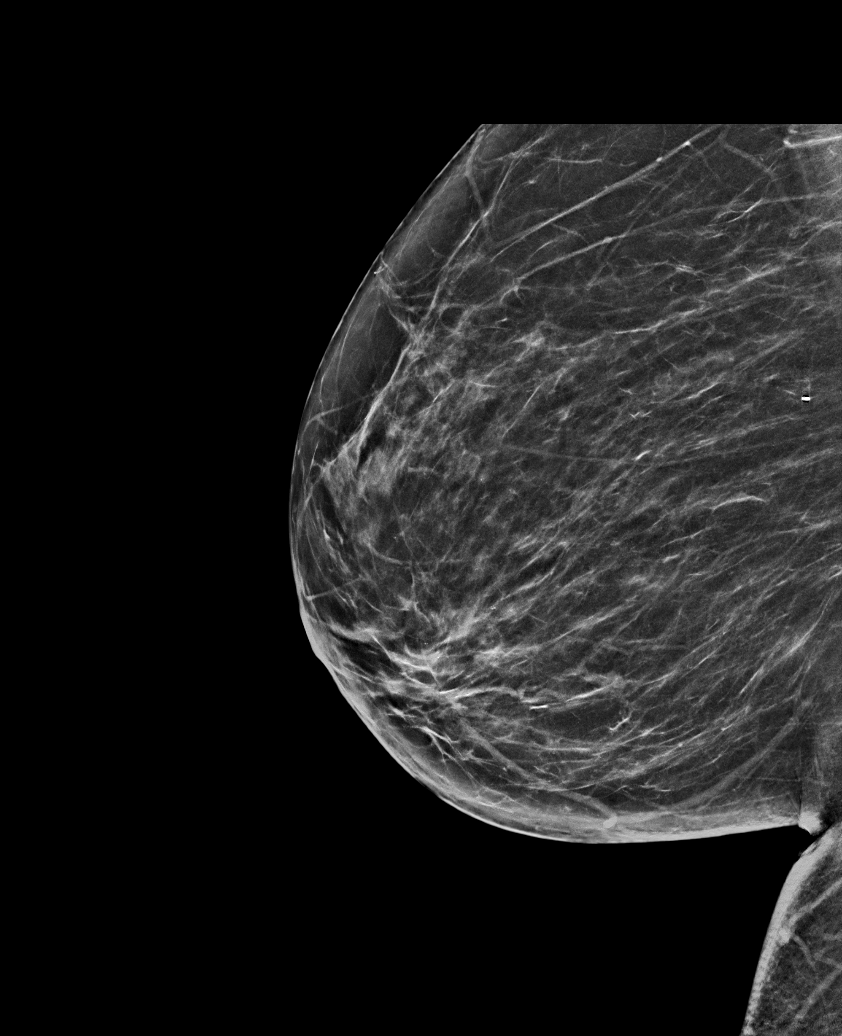

[R CC synth-2D]
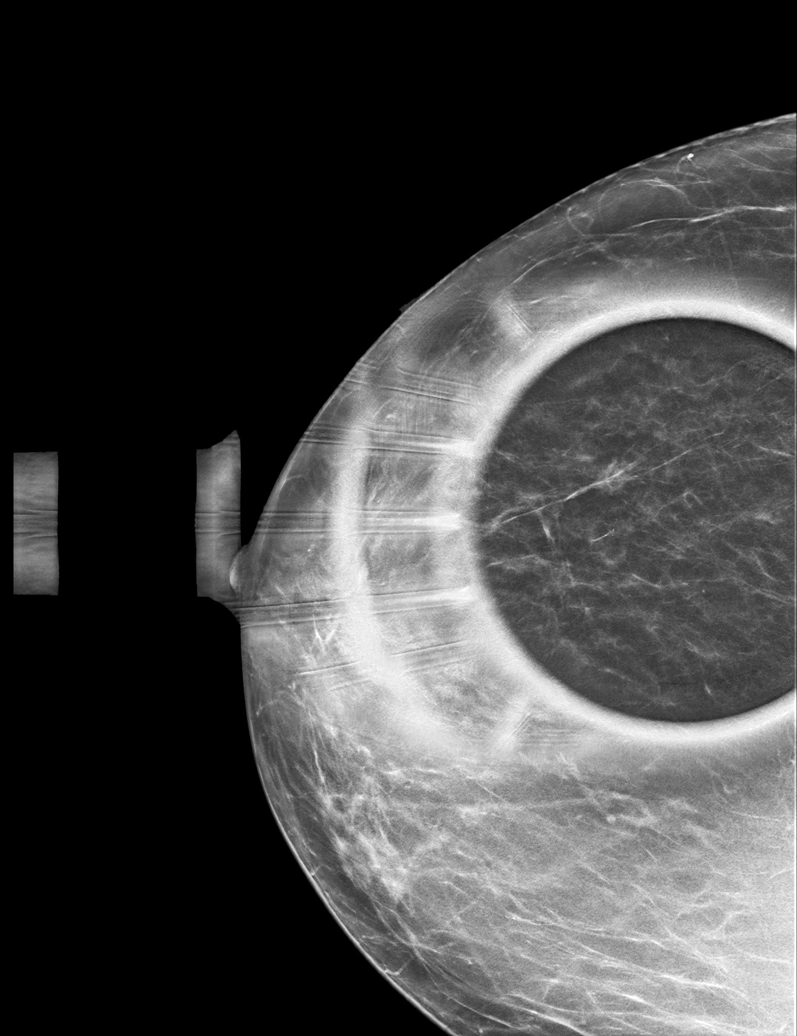

[6 of 21 positions shown; findings below may reference images not displayed]

ACR Breast Density Category b: There are scattered areas of
fibroglandular density.
FINDINGS: There is an irregular mass with internal and adjacent punctate
calcifications. The mass and calcifications together measure 1.2 x
1.9 x 1.2 cm.

On physical exam, question subtle focal thickening 8 o'clock
position right breast approximately 3-4 cm from the nipple.

Targeted ultrasound is performed, showing an irregular hypoechoic
mass with indistinct margins and internal echogenicities at 8
o'clock position 3-4 cm from the nipple. Mass measures 0.9 x 0.5 x
0.6 cm. Internal vascularity is seen.

Survey of the right axilla is negative for lymphadenopathy.
IMPRESSION: Suspicious mass with associated internal and adjacent
microcalcifications in the lower outer quadrant of the right breast.

RECOMMENDATION:
Ultrasound-guided core needle biopsy of the right breast mass is
recommended. If the mass is malignant requires excision, it is
recommended that the adjacent microcalcifications be excised with
the mass.

I have discussed the findings and recommendations with the patient.
If applicable, a reminder letter will be sent to the patient
regarding the next appointment.

BI-RADS CATEGORY  5: Highly suggestive of malignancy.

## 2021-02-04 IMAGING — MG US  BREAST BX W/ LOC DEV 1ST LESION IMG BX SPEC US GUIDE*R*
1 series · 8 of 8 positions shown · non-contrast
Comparison: Previous exam(s).
COMPARISON: Previous exam(s).

Addendum:
CLINICAL DATA: 69-year-old female presenting for ultrasound-guided
biopsy of a right breast mass.

EXAM:
ULTRASOUND GUIDED RIGHT BREAST CORE NEEDLE BIOPSY

[Series 1: MG view · 0.06mm/px · 8 of 15 slices shown]
[im 1/15]
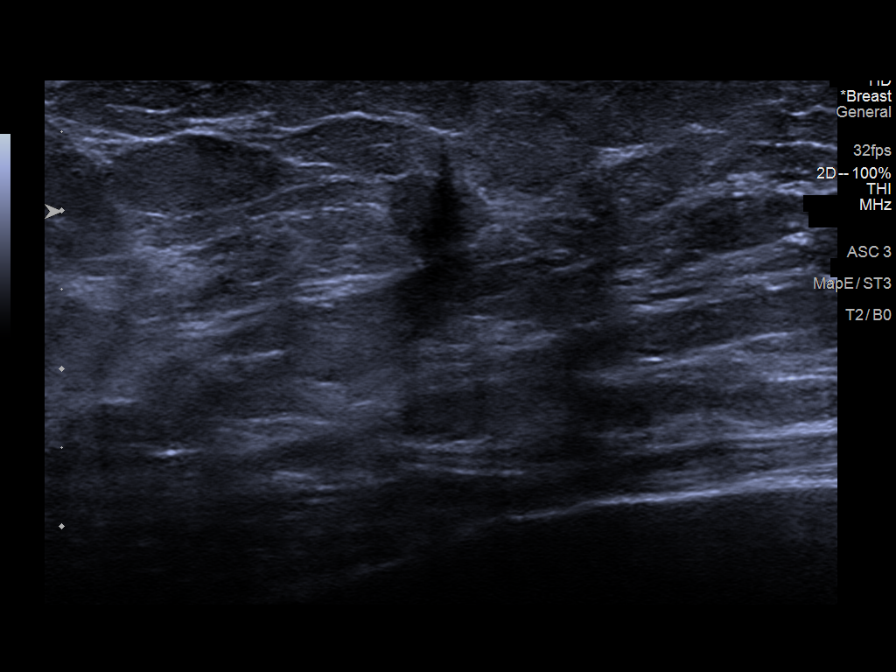
[im 3/15]
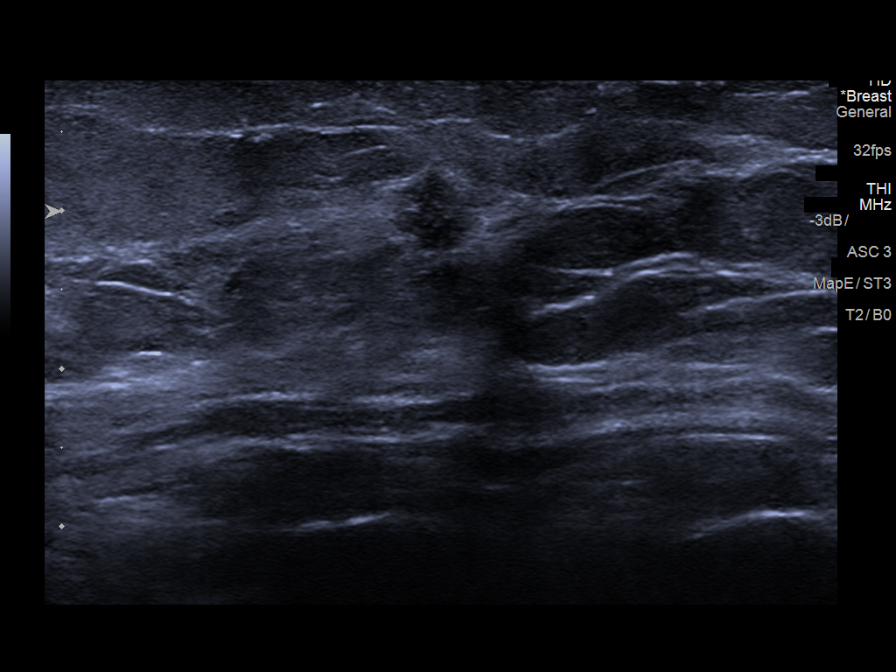
[im 5/15]
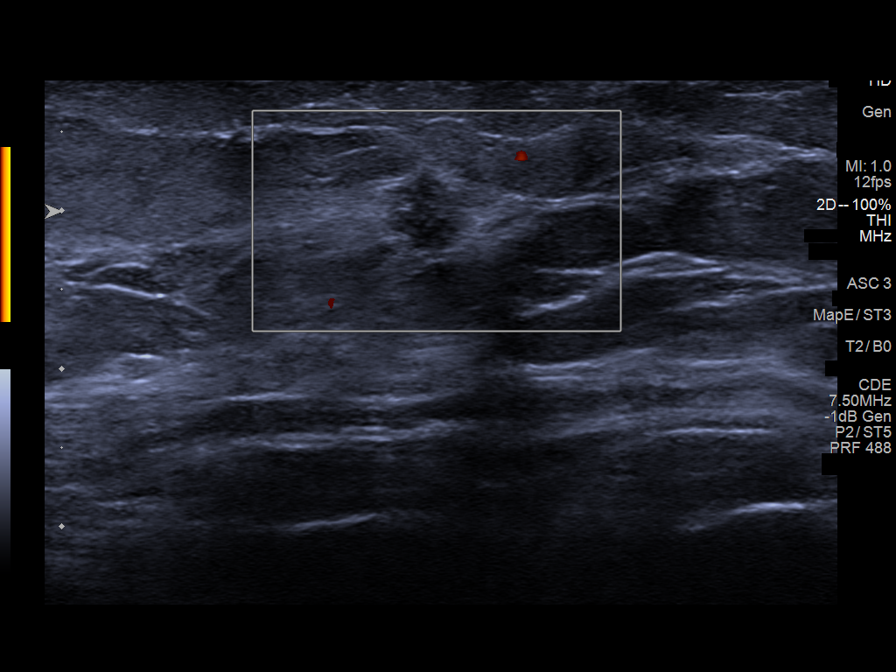
[im 7/15]
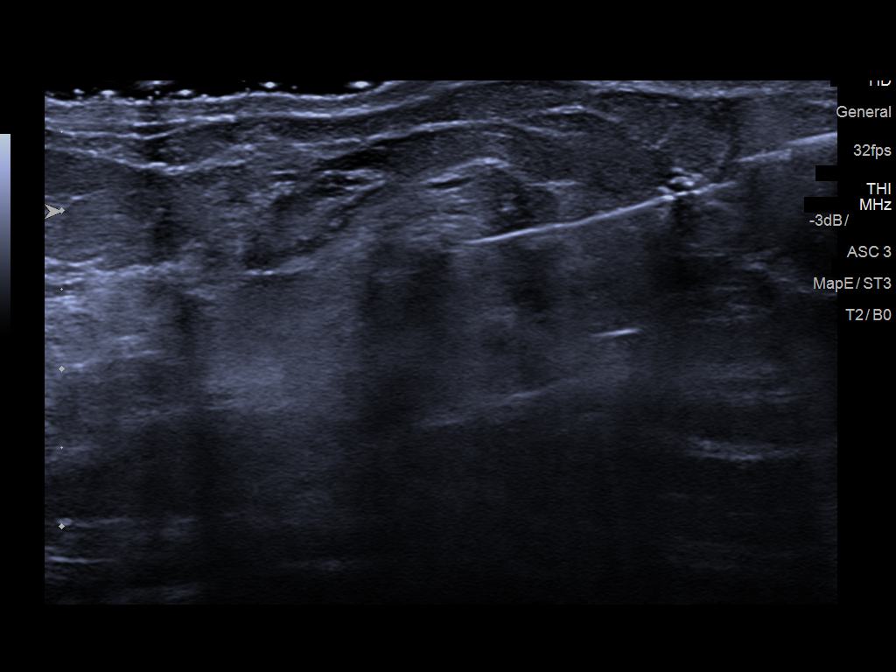
[im 9/15]
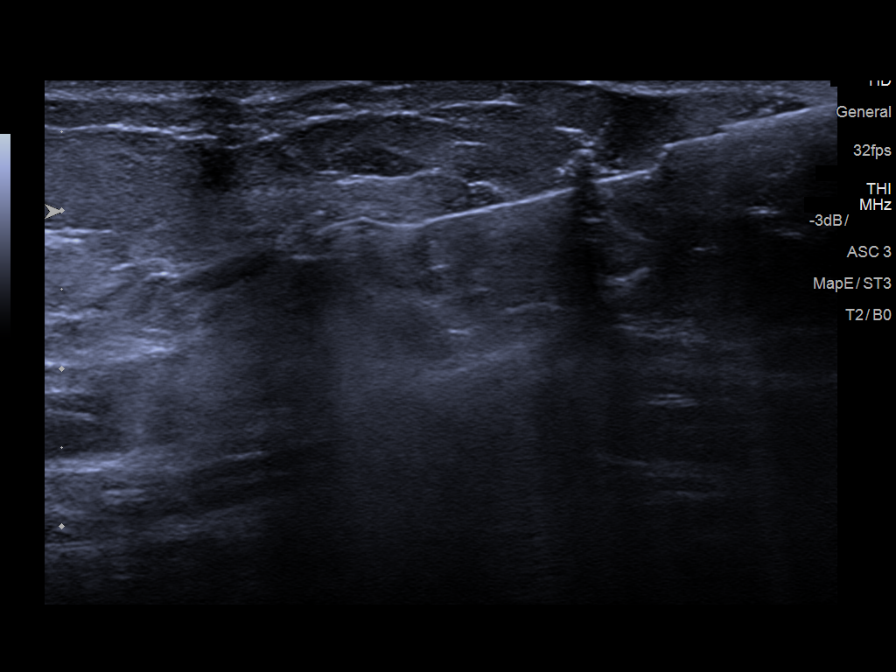
[im 11/15]
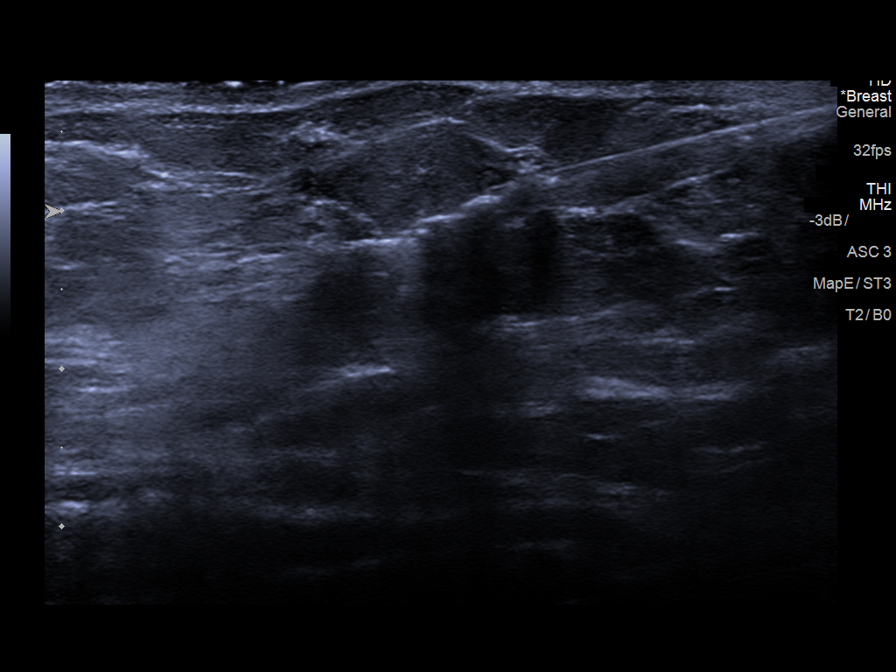
[im 13/15]
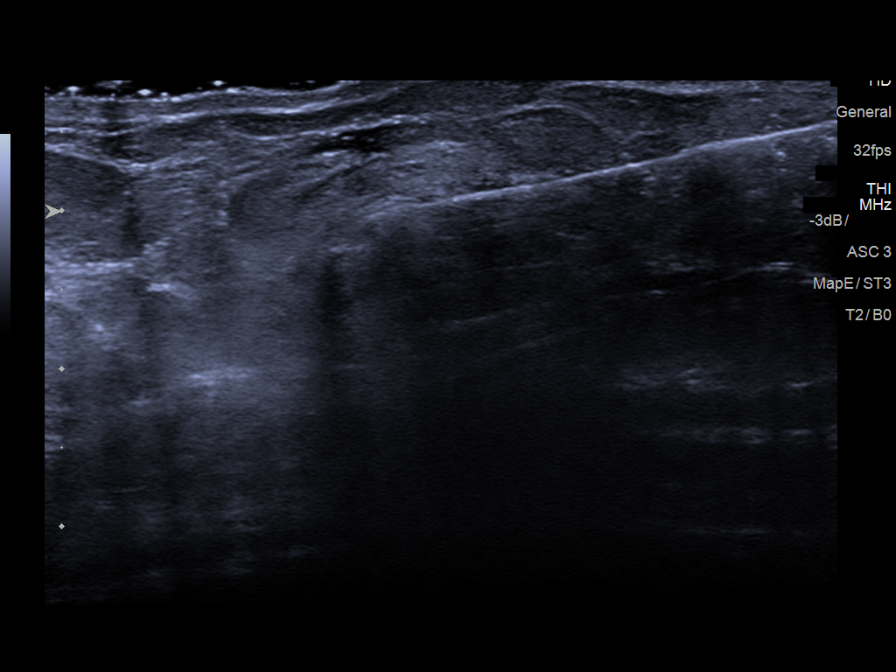
[im 15/15]
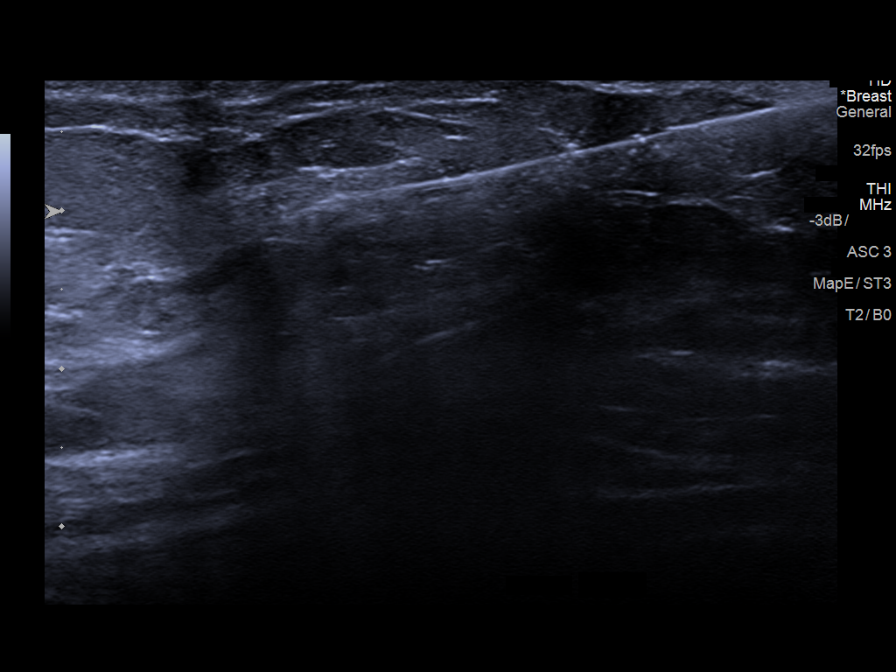

[8 of 8 positions shown; findings below may reference images not displayed]



Lesion quadrant: Lower outer quadrant

Using sterile technique and 1% Lidocaine as local anesthetic, under
direct ultrasound visualization, a 14 gauge Shazia device was
used to perform biopsy of a mass in the right breast at 8 o'clock
using an inferior approach. At the conclusion of the procedure Q
shaped tissue marker clip was deployed into the biopsy cavity.
Follow up 2 view mammogram was performed and dictated separately.
IMPRESSION: Ultrasound guided biopsy of a right breast mass at 8 o'clock. No
apparent complications.

ADDENDUM:
PATHOLOGY revealed: A. BREAST, RIGHT AT [DATE], 3 CM FROM THE NIPPLE;
ULTRASOUND-GUIDED CORE NEEDLE BIOPSY: - INVASIVE MAMMARY CARCINOMA,
WITH MICROPAPILLARY FEATURES. At least 6 mm in this sample. Grade 2.
Ductal carcinoma in situ: Not identified. Lymphovascular invasion:
Not identified.

Pathology results are CONCORDANT with imaging findings, per Dr.
Marlito Po.

Pathology results and recommendations below were discussed with
patient by telephone on 11/27/2019. Patient reported biopsy site
within normal limits with slight tenderness at the site. Post biopsy
care instructions were reviewed and questions were answered. Patient
was instructed to call [HOSPITAL] if any concerns or
questions arise related to the biopsy.

Recommendation: Surgical referral. Request for surgical referral was
relayed to Forest Crowley RN and Shuaib Prempeh RN at [HOSPITAL]
[HOSPITAL] by Nomasibulele Moatshe RN on 11/27/2019.

Addendum by Nomasibulele Moatshe RN on 11/27/2019.



Lesion quadrant: Lower outer quadrant

Using sterile technique and 1% Lidocaine as local anesthetic, under
direct ultrasound visualization, a 14 gauge Shazia device was
used to perform biopsy of a mass in the right breast at 8 o'clock
using an inferior approach. At the conclusion of the procedure Q
shaped tissue marker clip was deployed into the biopsy cavity.
Follow up 2 view mammogram was performed and dictated separately.
IMPRESSION: Ultrasound guided biopsy of a right breast mass at 8 o'clock. No
apparent complications.

## 2022-07-04 ENCOUNTER — Ambulatory Visit
Admission: EM | Admit: 2022-07-04 | Discharge: 2022-07-04 | Disposition: A | Discharge status: Home / Self Care | Payer: Medicare Other | Attending: Emergency Medicine | Admitting: Emergency Medicine

## 2022-07-04 DIAGNOSIS — R300 Dysuria: Secondary | ICD-10-CM | POA: Diagnosis present

## 2022-07-04 LAB — POCT URINALYSIS DIP (MANUAL ENTRY)
Bilirubin, UA: NEGATIVE
Glucose, UA: NEGATIVE mg/dL
Ketones, POC UA: NEGATIVE mg/dL
Nitrite, UA: NEGATIVE
Protein Ur, POC: NEGATIVE mg/dL
Spec Grav, UA: 1.015 (ref 1.010–1.025)
Urobilinogen, UA: 0.2 E.U./dL
pH, UA: 6 (ref 5.0–8.0)

## 2022-07-04 MED ORDER — CEPHALEXIN 500 MG PO CAPS: 500.0000 mg | ORAL_CAPSULE | Freq: Two times a day (BID) | ORAL | 0 refills | Status: AC

## 2022-07-04 NOTE — Discharge Instructions (Addendum)
Take the antibiotic as directed.  The urine culture is pending.  We will call you if it shows the need to change or discontinue your antibiotic.    Follow up with your primary care provider.    

## 2022-07-04 NOTE — ED Provider Notes (Signed)
Roderic Palau    CSN: [id number] Arrival date & time: 07/04/22  1359      History   Chief Complaint Chief Complaint  Patient presents with   Dysuria    HPI Loretta Wong is a 72 y.o. female.  Patient presents with dysuria, urinary frequency, urinary urgency x 1 day.  She denies fever, chills, abdominal pain, flank pain, hematuria, pelvic pain, or other symptoms.  Treatment at home with cranberry.  Patient was seen at [facility] on 05/18/2022; diagnosed with UTI; treated with Cipro x 7 days.  The history is provided by the patient and medical records.    Past Medical History:  Diagnosis Date   Arthritis    Complication of anesthesia    GB SURGERY BP DOWN/KEPT OVERNIGHT   Hypertension    MONITORING    Patient Active Problem List   Diagnosis Date Noted   Ventral hernia without obstruction or gangrene 12/15/2018    Past Surgical History:  Procedure Laterality Date   BREAST BIOPSY Right 2002   neg   CHOLECYSTECTOMY     FRACTURE SURGERY     TONSILLECTOMY     VENTRAL HERNIA REPAIR N/A 12/15/2018   Procedure: OPEN HERNIA REPAIR VENTRAL ADULT WITH MESH;  Surgeon: Benjamine Sprague, DO;  Location: [facility] ORS;  Service: General;  Laterality: N/A;    OB History   No obstetric history on file.      Home Medications    Prior to Admission medications   Medication Sig Start Date End Date Taking? Authorizing Provider  celecoxib (CELEBREX) 100 MG capsule Take 1 capsule by mouth 2 (two) times daily. 05/05/22  Yes [provider]  cephALEXin (KEFLEX) 500 MG capsule Take 1 capsule (500 mg total) by mouth 2 (two) times daily for 5 days. 07/04/22 07/09/22 Yes Sharion Balloon, NP  tamoxifen (NOLVADEX) 20 MG tablet Take by mouth. 04/16/22  Yes [provider]  cetirizine (ZYRTEC) 10 MG tablet Take 10 mg by mouth daily as needed for allergies.    [provider]  ibuprofen (ADVIL) 800 MG tablet Take 1 tablet (800 mg total) by mouth every 8 (eight) hours as  needed for mild pain or moderate pain. 12/16/18   Lysle Pearl, Isami, DO  pseudoephedrine (SUDAFED) 30 MG tablet Take 60 mg by mouth every 4 (four) hours as needed for congestion. Patient not taking: Reported on 07/04/2022    [provider]    Family History Family History  Problem Relation Age of Onset   Breast cancer Mother 64    Social History Social History   Tobacco Use   Smoking status: Never   Smokeless tobacco: Never  Substance Use Topics   Alcohol use: Never   Drug use: Never     Allergies   Sulfa antibiotics and Neomycin   Review of Systems Review of Systems  Constitutional:  Negative for chills and fever.  Gastrointestinal:  Negative for abdominal pain, diarrhea, nausea and vomiting.  Genitourinary:  Positive for dysuria, frequency and urgency. Negative for flank pain, hematuria, pelvic pain and vaginal discharge.  Skin:  Negative for color change and rash.  All other systems reviewed and are negative.    Physical Exam Triage Vital Signs ED Triage Vitals  Enc Vitals Group     BP 07/04/22 1454 128/78     Pulse Rate 07/04/22 1442 84     Resp 07/04/22 1442 18     Temp 07/04/22 1442 97.7 F (36.5 C)     Temp src --  SpO2 07/04/22 1442 92 %     Weight --      Height --      Head Circumference --      Peak Flow --      Pain Score 07/04/22 1450 0     Pain Loc --      Pain Edu? --      Excl. in [place]? --    No data found.  Updated Vital Signs BP 128/78   Pulse 84   Temp 97.7 F (36.5 C)   Resp 18   SpO2 92%   Visual Acuity Right Eye Distance:   Left Eye Distance:   Bilateral Distance:    Right Eye Near:   Left Eye Near:    Bilateral Near:     Physical Exam Vitals and nursing note reviewed.  Constitutional:      General: She is not in acute distress.    Appearance: Normal appearance. She is well-developed. She is not ill-appearing.  HENT:     Mouth/Throat:     Mouth: Mucous membranes are moist.  Cardiovascular:     Rate and  Rhythm: Normal rate and regular rhythm.     Heart sounds: Normal heart sounds.  Pulmonary:     Effort: Pulmonary effort is normal. No respiratory distress.     Breath sounds: Normal breath sounds.  Abdominal:     General: Bowel sounds are normal.     Palpations: Abdomen is soft.     Tenderness: There is no abdominal tenderness. There is no right CVA tenderness, left CVA tenderness, guarding or rebound.  Musculoskeletal:     Cervical back: Neck supple.  Skin:    General: Skin is warm and dry.  Neurological:     Mental Status: She is alert.  Psychiatric:        Mood and Affect: Mood normal.        Behavior: Behavior normal.      UC Treatments / Results  Labs (all labs ordered are listed, but only abnormal results are displayed) Labs Reviewed  POCT URINALYSIS DIP (MANUAL ENTRY) - Abnormal; Notable for the following components:      Result Value   Clarity, UA cloudy (*)    Blood, UA moderate (*)    Leukocytes, UA Moderate (2+) (*)    All other components within normal limits  URINE CULTURE    EKG   Radiology No results found.  Procedures Procedures (including critical care time)  Medications Ordered in UC Medications - No data to display  Initial Impression / Assessment and Plan / UC Course  I have reviewed the triage vital signs and the nursing notes.  Pertinent labs & imaging results that were available during my care of the patient were reviewed by me and considered in my medical decision making (see chart for details).    Dysuria.  Treating with Keflex. Urine culture pending. Discussed with patient that we will call her if the urine culture shows the need to change or discontinue the antibiotic. Instructed her to follow-up with her PCP for recurrent urine symptoms. Patient agrees to plan of care.     Final Clinical Impressions(s) / UC Diagnoses   Final diagnoses:  Dysuria     Discharge Instructions      Take the antibiotic as directed.  The urine  culture is pending.  We will call you if it shows the need to change or discontinue your antibiotic.    Follow up with your primary care provider.  ED Prescriptions     Medication Sig Dispense Auth. Provider   cephALEXin (KEFLEX) 500 MG capsule Take 1 capsule (500 mg total) by mouth 2 (two) times daily for 5 days. 10 capsule Sharion Balloon, NP      PDMP not reviewed this encounter.   Sharion Balloon, NP 07/04/22 [phone]

## 2022-07-04 NOTE — ED Triage Notes (Signed)
Patient to Urgent Care with complaints of urinary frequency/ urgency/ bladder pressure and dysuria.   Symptoms started yesterday. Denies any other symptoms.

## 2022-07-05 LAB — URINE CULTURE: Culture: NO GROWTH

## 2022-09-16 ENCOUNTER — Ambulatory Visit (INDEPENDENT_AMBULATORY_CARE_PROVIDER_SITE_OTHER): Payer: Medicare Other | Admitting: Urology

## 2022-09-16 ENCOUNTER — Encounter: Payer: Self-pay | Admitting: Urology

## 2022-09-16 VITALS — BP 187/91 | HR 71 | Ht 66.0 in | Wt 212.0 lb

## 2022-09-16 DIAGNOSIS — Z87440 Personal history of urinary (tract) infections: Secondary | ICD-10-CM | POA: Diagnosis not present

## 2022-09-16 DIAGNOSIS — Z09 Encounter for follow-up examination after completed treatment for conditions other than malignant neoplasm: Secondary | ICD-10-CM | POA: Diagnosis not present

## 2022-09-16 DIAGNOSIS — N390 Urinary tract infection, site not specified: Secondary | ICD-10-CM

## 2022-09-16 LAB — URINALYSIS, COMPLETE
Bilirubin, UA: NEGATIVE
Glucose, UA: NEGATIVE
Ketones, UA: NEGATIVE
Leukocytes,UA: NEGATIVE
Nitrite, UA: NEGATIVE
Protein,UA: NEGATIVE
RBC, UA: NEGATIVE
Specific Gravity, UA: 1.025 (ref 1.005–1.030)
Urobilinogen, Ur: 0.2 mg/dL (ref 0.2–1.0)
pH, UA: 5 (ref 5.0–7.5)

## 2022-09-16 LAB — MICROSCOPIC EXAMINATION: Epithelial Cells (non renal): >10 /hpf — AB (ref 0–10)

## 2022-09-16 LAB — BLADDER SCAN AMB NON-IMAGING

## 2022-09-16 MED ORDER — ESTRADIOL 0.1 MG/GM VA CREA: TOPICAL_CREAM | VAGINAL | 11 refills | Status: DC

## 2022-09-16 MED ORDER — NITROFURANTOIN MONOHYD MACRO 100 MG PO CAPS: 100.0000 mg | ORAL_CAPSULE | Freq: Every day | ORAL | 0 refills | Status: DC

## 2022-09-16 NOTE — Progress Notes (Signed)
   09/16/22 9:53 AM   Hassell Done 1950/11/04 [id number]  CC: Recurrent UTI  HPI: 72 year old female with history of breast cancer treated with surgery and on tamoxifen in the last few years who presents with 6 months of recurrent UTIs.  These are E. coli and culture documented in care everywhere.  Her symptoms are dysuria and urgency/frequency, and resolved with antibiotics.  She denies any hematuria none at the time of UTI.  Typically prior to that she would get 2-3 infections per year.  She denies any flank pain.  CT in 2020 was benign from a urologic perspective.   PMH: Past Medical History:  Diagnosis Date   Arthritis    Complication of anesthesia    GB SURGERY BP DOWN/KEPT OVERNIGHT   Hypertension    MONITORING    Surgical History: Past Surgical History:  Procedure Laterality Date   BREAST BIOPSY Right 2002   neg   CHOLECYSTECTOMY     FRACTURE SURGERY     TONSILLECTOMY     VENTRAL HERNIA REPAIR N/A 12/15/2018   Procedure: OPEN HERNIA REPAIR VENTRAL ADULT WITH MESH;  Surgeon: Sung Amabile, DO;  Location: [facility] ORS;  Service: General;  Laterality: N/A;     Family History: Family History  Problem Relation Age of Onset   Breast cancer Mother 110    Social History:  reports that she has never smoked. She has never been exposed to tobacco smoke. She has never used smokeless tobacco. She reports that she does not drink alcohol and does not use drugs.  Physical Exam: BP (!) 187/91   Pulse 71   Ht 5\' 6"  (1.676 m)   Wt 212 lb (96.2 kg)   BMI 34.22 kg/m    Constitutional:  Alert and oriented, No acute distress. Cardiovascular: No clubbing, cyanosis, or edema. Respiratory: Normal respiratory effort, no increased work of breathing. GI: Abdomen is soft, nontender, nondistended, no abdominal masses   Laboratory Data: Culture history reviewed in care everywhere, multiple E. coli UTIs  Pertinent Imaging: I have personally viewed and interpreted the CT abdomen and pelvis  with contrast from July 2020 showing no kidney stones, hydronephrosis, or bladder abnormalities  Assessment & Plan:   72 year old female with history of breast cancer with recurrent UTIs over the last 5 months, E. coli and culture documented.  Symptoms resolved with antibiotics, has been asymptomatic the last 3 weeks after course of Cipro.  We discussed the evaluation and treatment of patients with recurrent UTIs at length.  We specifically discussed the differences between asymptomatic bacteriuria and true urinary tract infection.  We discussed the AUA definition of recurrent UTI of at least 2 culture proven symptomatic acute cystitis episodes in a 47-month period, or 3 within a 1 year period.  We discussed the importance of culture directed antibiotic treatment, and antibiotic stewardship.  First-line therapy includes nitrofurantoin(5 days), Bactrim(3 days), or fosfomycin(3 g single dose).  Possible etiologies of recurrent infection include periurethral tissue atrophy in postmenopausal woman, constipation, sexual activity, incomplete emptying, anatomic abnormalities, and even genetic predisposition.  Finally, we discussed the role of perineal hygiene, timed voiding, adequate hydration, topical vaginal estrogen, cranberry prophylaxis, and low-dose antibiotic prophylaxis.  Trial of topical estrogen cream, nitrofurantoin prophylaxis x 90 days, cranberry tablets RTC 4 months symptom check, consider further imaging if recurrent infections despite above  Legrand Rams, MD 09/16/2022  [facility] [address] [place], [place] [address] [phone]

## 2022-12-28 ENCOUNTER — Encounter: Payer: Self-pay | Admitting: Urology

## 2023-01-18 ENCOUNTER — Ambulatory Visit (INDEPENDENT_AMBULATORY_CARE_PROVIDER_SITE_OTHER): Payer: Medicare Other | Admitting: Urology

## 2023-01-18 ENCOUNTER — Encounter: Payer: Self-pay | Admitting: Urology

## 2023-01-18 VITALS — BP 173/77 | HR 69 | Ht 66.0 in | Wt 218.3 lb

## 2023-01-18 DIAGNOSIS — Z87440 Personal history of urinary (tract) infections: Secondary | ICD-10-CM

## 2023-01-18 DIAGNOSIS — N3281 Overactive bladder: Secondary | ICD-10-CM

## 2023-01-18 DIAGNOSIS — N958 Other specified menopausal and perimenopausal disorders: Secondary | ICD-10-CM | POA: Diagnosis not present

## 2023-01-18 DIAGNOSIS — N390 Urinary tract infection, site not specified: Secondary | ICD-10-CM

## 2023-01-18 MED ORDER — OXYBUTYNIN CHLORIDE ER 10 MG PO TB24: 10.0000 mg | ORAL_TABLET | Freq: Every day | ORAL | 11 refills | Status: DC

## 2023-01-18 NOTE — Patient Instructions (Signed)

## 2023-01-18 NOTE — Progress Notes (Signed)
   01/18/2023 10:49 AM   Hassell Done 1951-03-26 [id number]  Reason for visit: Follow up recurrent UTI, GSM, OAB  HPI: 72 year old female who I originally met in May 2024 for recurrent UTI.  She has a history of breast cancer treated with surgery and on tamoxifen.  Cultures were E. coli and documented in care everywhere.  UTI symptoms were dysuria, urgency/frequency.  CT abdomen and pelvis with contrast from July 2020 was benign from a urologic perspective.  Using shared decision making, she opted for a trial of topical estrogen cream, 90 days of nitrofurantoin prophylaxis and cranberry tablets.  She has been doing very well since that time, she denies any recurrent infections.  She does report some persistent overactive symptoms with urgency, frequency, nocturia.  She is at the point where she is interested in trying medication.  She does drink some soda during the day, and behavioral strategies of avoiding bladder irritants were discussed.  We discussed that overactive bladder (OAB) is not a disease, but is a symptom complex that is generally not life-threatening.  Symptoms typically include urinary urgency, frequency, and urge incontinence.  There are numerous treatment options, however there are risks and benefits with both medical and surgical management.  First-line treatment is behavioral therapies including bladder training, pelvic floor muscle training, and fluid management.  Second line treatments include oral antimuscarinics(Ditropan er, Trospium) and beta-3 agonist (Mybetriq). There is typically a period of medication trial (4-8 weeks) to find the optimal therapy and dosing. If symptoms are bothersome despite the above management, third line options include intra-detrusor botox, peripheral tibial nerve stimulation (PTNS), and interstim (SNS). These are more invasive treatments with higher side effect profile, but may improve quality of life for patients with severe OAB symptoms.    Continue topical estrogen cream, cranberry tablet prophylaxis Trial of oxybutynin 10 mg XL, risks and benefits discussed RTC 4 to 6 weeks symptom check  Sondra Come, MD  [facility] [address] [place], [place] [address] [phone]

## 2023-01-20 ENCOUNTER — Ambulatory Visit: Payer: Medicare Other | Admitting: Urology

## 2023-03-01 NOTE — Progress Notes (Unsigned)
03/02/2023 12:34 PM   Hassell Done [birth year] [id number]  Referring provider: Jerl Mina, MD [address] [place],  [place] [address]  Urological history: 1. rUTI's -Contributing factors of age, GSM and constipation -Documented urine cultures over the last year  February 19, 2023-E. Coli  August 27, 2022-E. Coli  July 18, 2022-E. coli  July 04, 2022-no growth  May 18, 2022 -mixed urogenital flora -Vaginal estrogen cream and cranberry tablets  2. OAB -Contribute factors of age and GSM  No chief complaint on file.  HPI: Loretta Wong is a 72 y.o. female who presents today for follow up.  Previous records reviewed.   She has been followed by Dr. Richardo Hanks for rUTI, GSM and OAB.  She was recently given a trial of oxybutynin 10 mg XL and presents today for follow-up.  Patient denies any modifying or aggravating factors.  Patient denies any recent UTI's, gross hematuria, dysuria or suprapubic/flank pain.  Patient denies any fevers, chills, nausea or vomiting.    PVR ***   PMH: Past Medical History:  Diagnosis Date   Arthritis    Complication of anesthesia    GB SURGERY BP DOWN/KEPT OVERNIGHT   Hypertension    MONITORING    Surgical History: Past Surgical History:  Procedure Laterality Date   BREAST BIOPSY Right 2002   neg   CHOLECYSTECTOMY     FRACTURE SURGERY     TONSILLECTOMY     VENTRAL HERNIA REPAIR N/A 12/15/2018   Procedure: OPEN HERNIA REPAIR VENTRAL ADULT WITH MESH;  Surgeon: Sung Amabile, DO;  Location: [facility] ORS;  Service: General;  Laterality: N/A;    Home Medications:  Allergies as of 03/02/2023       Reactions   Sulfa Antibiotics Nausea And Vomiting, Other (See Comments)   Headaches, body aches   Neomycin Rash        Medication List        Accurate as of March 01, 2023 12:34 PM. If you have any questions, ask your nurse or doctor.          cetirizine 10 MG tablet Commonly known as: ZYRTEC Take 10  mg by mouth daily as needed for allergies.   estradiol 0.1 MG/GM vaginal cream Commonly known as: ESTRACE Estrogen Cream Instruction Discard applicator Apply pea sized amount to tip of finger to urethra before bed. Wash hands well after application. Use Monday, Wednesday and Friday   ibuprofen 800 MG tablet Commonly known as: ADVIL Take 1 tablet (800 mg total) by mouth every 8 (eight) hours as needed for mild pain or moderate pain.   oxybutynin 10 MG 24 hr tablet Commonly known as: DITROPAN-XL Take 1 tablet (10 mg total) by mouth daily.   tamoxifen 20 MG tablet Commonly known as: NOLVADEX Take by mouth.        Allergies:  Allergies  Allergen Reactions   Sulfa Antibiotics Nausea And Vomiting and Other (See Comments)    Headaches, body aches   Neomycin Rash    Family History: Family History  Problem Relation Age of Onset   Breast cancer Mother 47    Social History:  reports that she has never smoked. She has never been exposed to tobacco smoke. She has never used smokeless tobacco. She reports that she does not drink alcohol and does not use drugs.  ROS: Pertinent ROS in HPI  Physical Exam: There were no vitals taken for this visit.  Constitutional:  Well nourished. Alert and oriented, No acute  distress. HEENT: [place] AT, moist mucus membranes.  Trachea midline, no masses. Cardiovascular: No clubbing, cyanosis, or edema. Respiratory: Normal respiratory effort, no increased work of breathing. GU: No CVA tenderness.  No bladder fullness or masses.  Recession of labia minora, dry, pale vulvar vaginal mucosa and loss of mucosal ridges and folds.  Normal urethral meatus, no lesions, no prolapse, no discharge.   No urethral masses, tenderness and/or tenderness. No bladder fullness, tenderness or masses. *** vagina mucosa, *** estrogen effect, no discharge, no lesions, *** pelvic support, *** cystocele and *** rectocele noted.  No cervical motion tenderness.  Uterus is freely mobile  and non-fixed.  No adnexal/parametria masses or tenderness noted.  Anus and perineum are without rashes or lesions.   ***  Neurologic: Grossly intact, no focal deficits, moving all 4 extremities. Psychiatric: Normal mood and affect.    Laboratory Data: Urinalysis See HPI and EPIC I have reviewed the labs.   Pertinent Imaging: ***  Assessment & Plan:  ***  1. rUTI's -Asymptomatic at today's visit -UA ***  2. OAB -PVR demonstrates adequate emptying  3.  Microscopic hematuria -UA *** -Likely from UTI    No follow-ups on file.  These notes generated with voice recognition software. I apologize for typographical errors.  Loretta Wong  [facility] [address]  [address] [place], [place] [address] [phone]

## 2023-03-02 ENCOUNTER — Ambulatory Visit: Payer: Medicare Other | Admitting: Urology

## 2023-03-02 ENCOUNTER — Encounter: Payer: Self-pay | Admitting: Urology

## 2023-03-02 VITALS — BP 148/80 | HR 74 | Ht 66.0 in | Wt 218.0 lb

## 2023-03-02 DIAGNOSIS — R3129 Other microscopic hematuria: Secondary | ICD-10-CM

## 2023-03-02 DIAGNOSIS — Z87440 Personal history of urinary (tract) infections: Secondary | ICD-10-CM | POA: Diagnosis not present

## 2023-03-02 DIAGNOSIS — N390 Urinary tract infection, site not specified: Secondary | ICD-10-CM

## 2023-03-02 DIAGNOSIS — N3281 Overactive bladder: Secondary | ICD-10-CM | POA: Diagnosis not present

## 2023-03-02 LAB — MICROSCOPIC EXAMINATION

## 2023-03-02 LAB — URINALYSIS, COMPLETE
Bilirubin, UA: NEGATIVE
Glucose, UA: NEGATIVE
Ketones, UA: NEGATIVE
Leukocytes,UA: NEGATIVE
Nitrite, UA: NEGATIVE
Protein,UA: NEGATIVE
RBC, UA: NEGATIVE
Specific Gravity, UA: >1.03 — ABNORMAL HIGH (ref 1.005–1.030)
Urobilinogen, Ur: 0.2 mg/dL (ref 0.2–1.0)
pH, UA: 5 (ref 5.0–7.5)

## 2023-03-02 LAB — BLADDER SCAN AMB NON-IMAGING: Scan Result: 0

## 2023-03-02 MED ORDER — CIPROFLOXACIN HCL 250 MG PO TABS: 250.0000 mg | ORAL_TABLET | Freq: Two times a day (BID) | ORAL | 0 refills | Status: DC

## 2023-05-19 DIAGNOSIS — I10 Essential (primary) hypertension: Secondary | ICD-10-CM | POA: Insufficient documentation

## 2023-06-01 NOTE — Progress Notes (Unsigned)
06/03/2023 1:52 PM   Loretta Wong April 08, 1951 [id number]  Referring provider: Jerl Mina, MD [address] [place],  [place] [address]  Urological history: 1. rUTI's -Contributing factors of age, GSM and constipation -Documented urine cultures over the last year  February 19, 2023-E. Coli  August 27, 2022-E. Coli  July 18, 2022-E. coli  July 04, 2022-no growth  May 18, 2022 -mixed urogenital flora -Vaginal estrogen cream and cranberry tablets  2. OAB -Contribute factors of age and GSM -Oxybutynin XL 10 mg daily  Chief Complaint  Patient presents with   Recurrent UTI   Over Active Bladder   HPI: Loretta Wong is a 73 y.o. female who presents today for 3 month follow up.  Previous records reviewed.   At her visit on 03/02/2023, she has been followed by Dr. Richardo Hanks for rUTI, GSM and OAB.  She was recently given a trial of oxybutynin 10 mg XL and presents today for follow-up.  She was diagnosed with a UTI on February 19, 2023 in the interim.  Today's UA is unremarkable.  She is having 1-7 daytime voids, nocturia x 1-2 with strong urge to urinate.  She has urge incontinence.  She wears 2 panty liners daily.  She always engages in toilet mapping.  She does not limit fluid intake.  Patient denies any modifying or aggravating factors.  Patient denies any recent UTI's, gross hematuria, dysuria or suprapubic/flank pain.  Patient denies any fevers, chills, nausea or vomiting.  PVR 0 mL.  She has been taking the oxybutynin XL 10 mg daily and has not had any untoward side effects.  She states it has helped with her urinary symptoms.  She does not have any UTI symptoms at today's visit.  She continues to apply the vaginal estrogen cream and the cranberry tablets.  She is directed to continue the oxybutynin XL 10 mg daily.  She was also given a [place] in case she should have a UTI.  She has 1-7 daytime voids, with a mild urge to  urinate.  She does wear 1-2 depends daily.  She does limit fluid intake and she does engage in toilet mapping.   She states she feels the oxybutynin XL 10 mg daily did well with controlling her urinary symptoms, but it caused intolerable constipation.  Patient denies any modifying or aggravating factors.  Patient denies any recent UTI's, gross hematuria, dysuria or suprapubic/flank pain.  Patient denies any fevers, chills, nausea or vomiting.    PVR 116 mL   PMH: Past Medical History:  Diagnosis Date   Arthritis    Complication of anesthesia    GB SURGERY BP DOWN/KEPT OVERNIGHT   Hypertension    MONITORING    Surgical History: Past Surgical History:  Procedure Laterality Date   BREAST BIOPSY Right 2002   neg   CHOLECYSTECTOMY     FRACTURE SURGERY     TONSILLECTOMY     VENTRAL HERNIA REPAIR N/A 12/15/2018   Procedure: OPEN HERNIA REPAIR VENTRAL ADULT WITH MESH;  Surgeon: Sung Amabile, DO;  Location: [facility] ORS;  Service: General;  Laterality: N/A;    Home Medications:  Allergies as of 06/03/2023       Reactions   Oxybutynin Other (See Comments)   Constipation   Sulfa Antibiotics Nausea And Vomiting, Other (See Comments)   Headaches, body aches   Neomycin Rash        Medication List  Accurate as of June 03, 2023  1:52 PM. If you have any questions, ask your nurse or doctor.          STOP taking these medications    ciprofloxacin 250 MG tablet Commonly known as: Cipro       TAKE these medications    cetirizine 10 MG tablet Commonly known as: ZYRTEC Take 10 mg by mouth daily as needed for allergies.   estradiol 0.1 MG/GM vaginal cream Commonly known as: ESTRACE Estrogen Cream Instruction Discard applicator Apply pea sized amount to tip of finger to urethra before bed. Wash hands well after application. Use Monday, Wednesday and Friday   ibuprofen 800 MG tablet Commonly known as: ADVIL Take 1 tablet (800 mg total) by mouth every 8 (eight) hours as  needed for mild pain or moderate pain.   oxybutynin 10 MG 24 hr tablet Commonly known as: DITROPAN-XL Take 1 tablet (10 mg total) by mouth daily.   tamoxifen 20 MG tablet Commonly known as: NOLVADEX Take by mouth.        Allergies:  Allergies  Allergen Reactions   Oxybutynin Other (See Comments)    Constipation   Sulfa Antibiotics Nausea And Vomiting and Other (See Comments)    Headaches, body aches   Neomycin Rash    Family History: Family History  Problem Relation Age of Onset   Breast cancer Mother 75    Social History:  reports that she has never smoked. She has never been exposed to tobacco smoke. She has never used smokeless tobacco. She reports that she does not drink alcohol and does not use drugs.  ROS: Pertinent ROS in HPI  Physical Exam: BP (!) 177/81   Pulse 84   Ht 5\' 6"  (1.676 m)   Wt 221 lb 7 oz (100.4 kg)   BMI 35.74 kg/m   Constitutional:  Well nourished. Alert and oriented, No acute distress. HEENT: [place] AT, moist mucus membranes.  Trachea midline, no masses. Cardiovascular: No clubbing, cyanosis, or edema. Respiratory: Normal respiratory effort, no increased work of breathing. Neurologic: Grossly intact, no focal deficits, moving all 4 extremities. Psychiatric: Normal mood and affect.    Laboratory Data: Comprehensive Metabolic Panel (CMP) Order: [id number] Component Ref Range & Units 13 d ago  Sodium 135 - 145 mmol/L 135  Potassium 3.5 - 5.0 mmol/L 4.4  Chloride 98 - 108 mmol/L 102  Carbon Dioxide (CO2) 21 - 30 mmol/L 28  Urea Nitrogen (BUN) 7 - 20 mg/dL 14  Creatinine 0.4 - 1.0 mg/dL 0.7  Glucose 70 - 952 mg/dL 841  Comment: Interpretive Data: Above is the NONFASTING reference range.  Below are the FASTING reference ranges: NORMAL:      70-99 mg/dL PREDIABETES: 324-401 mg/dL DIABETES:    > 027 mg/dL  Calcium 8.7 - 25.3 mg/dL 9.4  AST (Aspartate Aminotransferase) 15 - 41 U/L 20  ALT (Alanine Aminotransferase) 10 - 39 U/L  17  Bilirubin, Total 0.4 - 1.5 mg/dL 0.4  Alk Phos (Alkaline Phosphatase) 24 - 110 U/L 47  Albumin 3.5 - 4.8 g/dL 3.9  Protein, Total 6.2 - 8.1 g/dL 7  Anion Gap 3 - 12 mmol/L 5  BUN/CREA Ratio 6 - 27 20  Glomerular Filtration Rate (eGFR) mL/min/1.73sq m 92  Comment: CKD-EPI (2021) does not include patient's race in the calculation of eGFR. Monitoring changes of plasma creatinine and eGFR over time is useful for monitoring kidney function.  This change was made on 07/09/2020.  Interpretive Ranges for eGFR(CKD-EPI 2021):  eGFR:              > 60 mL/min/1.73 sq m - Normal eGFR:              30 - 59 mL/min/1.73 sq m - Moderately Decreased eGFR:              15 - 29 mL/min/1.73 sq m - Severely Decreased eGFR:              < 15 mL/min/1.73 sq m -  Kidney Failure   Note: These eGFR calculations do not apply in acute situations when eGFR is changing rapidly or in patients on dialysis.  Resulting Agency [facility]   Specimen Collected: 05/19/23 12:10   Performed by: [facility] Last Resulted: 05/19/23 12:42  Received From: [facility]  Result Received: 06/01/23 12:43   25 OH Vitamin D Order: [id number] Component Ref Range & Units 13 d ago  Vitamin D Total, 25OH 30 - 100 ng/ml 33  Resulting Agency [facility]  Narrative Performed by [facility] Bone and Mineral Metabolism:  Vitamin D   25 OH Vitamin D Status                                                                                 <10 ng/mL        Deficiency                                           10-30 ng/mL      Insufficiency                                       30-100 ng/mL     Sufficiency                                         >100 ng/mL       Toxicity                                              Specimen Collected: 05/19/23 12:10   Performed by: [facility] Last Resulted:  05/19/23 13:22  Received From: [facility]  Result Received: 06/01/23 12:43  I have reviewed the labs.   Pertinent Imaging:  06/03/23 13:30  Scan Result 116 ml    Assessment & Plan:    1. rUTI's -Asymptomatic at today's visit -No issues with UTIs since last visit -Continue vaginal estrogen cream 3 nights weekly and cranberry tablets  2. OAB -PVR demonstrates adequate emptying -Experienced severe constipation with Ditropan XL 10 mg daily -We discussed switching to another OAB agent, but she would like to try Ditropan XL 10 mg 3 days weekly  to see if she can get good bladder control and less constipation -She will notify me via MyChart -If this is ineffective, we will go ahead and prescribe another OAB agent from anticholinergic family to see if she can get good bladder control and not have severe constipation -Her insurance will not cover Myrbetriq or Gemtesa  Return for Patient will notify us via MyChart .  These notes generated with voice recognition software. I apologize for typographical errors.  [name]  [facility] [address]  [address] [place], [place] [address] [phone]

## 2023-06-03 ENCOUNTER — Encounter: Payer: Self-pay | Admitting: Urology

## 2023-06-03 ENCOUNTER — Ambulatory Visit (INDEPENDENT_AMBULATORY_CARE_PROVIDER_SITE_OTHER): Payer: Medicare Other | Admitting: Urology

## 2023-06-03 ENCOUNTER — Other Ambulatory Visit: Payer: Self-pay | Admitting: Urology

## 2023-06-03 VITALS — BP 177/81 | HR 84 | Ht 66.0 in | Wt 221.4 lb

## 2023-06-03 DIAGNOSIS — N390 Urinary tract infection, site not specified: Secondary | ICD-10-CM

## 2023-06-03 DIAGNOSIS — Z87440 Personal history of urinary (tract) infections: Secondary | ICD-10-CM

## 2023-06-03 DIAGNOSIS — N3281 Overactive bladder: Secondary | ICD-10-CM

## 2023-06-03 LAB — BLADDER SCAN AMB NON-IMAGING: Scan Result: 116

## 2023-06-03 MED ORDER — OXYBUTYNIN CHLORIDE ER 10 MG PO TB24
10.0000 mg | ORAL_TABLET | Freq: Every day | ORAL | 3 refills | Status: DC
Start: 2023-06-03 — End: 2024-02-04

## 2023-06-03 MED ORDER — OXYBUTYNIN CHLORIDE ER 10 MG PO TB24: 10.0000 mg | ORAL_TABLET | Freq: Every day | ORAL | 3 refills | Status: DC

## 2023-06-03 NOTE — Patient Instructions (Signed)
Take the oxybutynin XL 10 mg daily for 1 week or 2 weeks and then Monday, Wednesday and Fridays.

## 2023-08-05 ENCOUNTER — Encounter: Payer: Self-pay | Admitting: Internal Medicine

## 2023-08-17 ENCOUNTER — Encounter: Payer: Self-pay | Admitting: Internal Medicine

## 2023-08-17 ENCOUNTER — Ambulatory Visit: Admitting: Anesthesiology

## 2023-08-17 ENCOUNTER — Encounter
Admission: RE | Disposition: A | Discharge status: Home / Self Care | Payer: Self-pay | Source: Ambulatory Visit | Attending: Internal Medicine

## 2023-08-17 ENCOUNTER — Ambulatory Visit
Admission: RE | Admit: 2023-08-17 | Discharge: 2023-08-17 | Disposition: A | Discharge status: Home / Self Care | Payer: Medicare Other | Source: Ambulatory Visit | Attending: Internal Medicine | Admitting: Internal Medicine

## 2023-08-17 DIAGNOSIS — I10 Essential (primary) hypertension: Secondary | ICD-10-CM | POA: Diagnosis not present

## 2023-08-17 DIAGNOSIS — D122 Benign neoplasm of ascending colon: Secondary | ICD-10-CM | POA: Diagnosis not present

## 2023-08-17 DIAGNOSIS — Z1211 Encounter for screening for malignant neoplasm of colon: Secondary | ICD-10-CM | POA: Diagnosis present

## 2023-08-17 DIAGNOSIS — K5730 Diverticulosis of large intestine without perforation or abscess without bleeding: Secondary | ICD-10-CM | POA: Insufficient documentation

## 2023-08-17 DIAGNOSIS — Z800 Family history of malignant neoplasm of digestive organs: Secondary | ICD-10-CM | POA: Insufficient documentation

## 2023-08-17 DIAGNOSIS — Z85038 Personal history of other malignant neoplasm of large intestine: Secondary | ICD-10-CM | POA: Diagnosis not present

## 2023-08-17 DIAGNOSIS — D125 Benign neoplasm of sigmoid colon: Secondary | ICD-10-CM | POA: Insufficient documentation

## 2023-08-17 DIAGNOSIS — K640 First degree hemorrhoids: Secondary | ICD-10-CM | POA: Insufficient documentation

## 2023-08-17 HISTORY — DX: Malignant (primary) neoplasm, unspecified: C80.1

## 2023-08-17 HISTORY — PX: COLONOSCOPY WITH PROPOFOL: SHX5780

## 2023-08-17 HISTORY — PX: POLYPECTOMY: SHX149

## 2023-08-17 HISTORY — DX: Malignant neoplasm of lower-outer quadrant of right female breast: C50.511

## 2023-08-17 SURGERY — COLONOSCOPY WITH PROPOFOL
Anesthesia: General

## 2023-08-17 MED ORDER — LIDOCAINE HCL (CARDIAC) PF 100 MG/5ML IV SOSY
PREFILLED_SYRINGE | INTRAVENOUS | Status: DC | PRN
  Administered 2023-08-17: 80 mg via INTRAVENOUS

## 2023-08-17 MED ORDER — SODIUM CHLORIDE 0.9 % IV SOLN
INTRAVENOUS | Status: DC
Start: 2023-08-17 — End: 2023-08-17

## 2023-08-17 MED ORDER — DEXMEDETOMIDINE HCL IN NACL 80 MCG/20ML IV SOLN
INTRAVENOUS | Status: DC | PRN
  Administered 2023-08-17: 20 ug via INTRAVENOUS

## 2023-08-17 MED ORDER — PROPOFOL 500 MG/50ML IV EMUL
INTRAVENOUS | Status: DC | PRN
  Administered 2023-08-17: 75 ug/kg/min via INTRAVENOUS

## 2023-08-17 MED ORDER — PROPOFOL 10 MG/ML IV BOLUS
INTRAVENOUS | Status: DC | PRN
  Administered 2023-08-17: 20 mg via INTRAVENOUS
  Administered 2023-08-17: 10 mg via INTRAVENOUS
  Administered 2023-08-17: 50 mg via INTRAVENOUS

## 2023-08-17 NOTE — Anesthesia Preprocedure Evaluation (Signed)
 Anesthesia Evaluation  Patient identified by MRN, date of birth, ID band Patient awake    Reviewed: Allergy & Precautions, NPO status , Patient's Chart, lab work & pertinent test results  History of Anesthesia Complications (+) history of anesthetic complications  Airway Mallampati: III  TM Distance: <3 FB Neck ROM: full    Dental  (+) Chipped   Pulmonary neg pulmonary ROS, neg shortness of breath   Pulmonary exam normal        Cardiovascular Exercise Tolerance: Good hypertension, Normal cardiovascular exam     Neuro/Psych negative neurological ROS  negative psych ROS   GI/Hepatic negative GI ROS, Neg liver ROS,neg GERD  ,,  Endo/Other  negative endocrine ROS    Renal/GU negative Renal ROS  negative genitourinary   Musculoskeletal   Abdominal   Peds  Hematology negative hematology ROS (+)   Anesthesia Other Findings Past Medical History: No date: Arthritis No date: Cancer (HCC) No date: Complication of anesthesia     Comment:  GB SURGERY BP DOWN/KEPT OVERNIGHT No date: Hypertension     Comment:  MONITORING No date: 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 [facility])  Past Surgical History: 2002: BREAST BIOPSY; Right     Comment:  neg No date: CATARACT EXTRACTION; Bilateral No date: CHOLECYSTECTOMY No date: EYE SURGERY No date: FRACTURE SURGERY No date: HERNIA REPAIR No date: MASTECTOMY No date: TONSILLECTOMY 12/15/2018: VENTRAL HERNIA REPAIR; N/A     Comment:  Procedure: OPEN HERNIA REPAIR VENTRAL ADULT WITH MESH;                Surgeon: Sung Amabile, DO;  Location: [facility] ORS;  Service:              General;  Laterality: N/A;  BMI    Body Mass Index: 34.73 kg/m      Reproductive/Obstetrics negative OB ROS                             Anesthesia Physical Anesthesia Plan  ASA: 2  Anesthesia Plan: General   Post-op Pain Management:     Induction: Intravenous  PONV Risk Score and Plan: Propofol infusion and TIVA  Airway Management Planned: Natural Airway and Nasal Cannula  Additional Equipment:   Intra-op Plan:   Post-operative Plan:   Informed Consent: I have reviewed the patients History and Physical, chart, labs and discussed the procedure including the risks, benefits and alternatives for the proposed anesthesia with the patient or authorized representative who has indicated his/her understanding and acceptance.     Dental Advisory Given  Plan Discussed with: Anesthesiologist, CRNA and Surgeon  Anesthesia Plan Comments: (Patient consented for risks of anesthesia including but not limited to:  - adverse reactions to medications - risk of airway placement if required - damage to eyes, teeth, lips or other oral mucosa - nerve damage due to positioning  - sore throat or hoarseness - Damage to heart, brain, nerves, lungs, other parts of body or loss of life  Patient voiced understanding and assent.)       Anesthesia Quick Evaluation

## 2023-08-17 NOTE — Transfer of Care (Signed)
 Immediate Anesthesia Transfer of Care Note  Patient: Loretta Wong  Procedure(s) Performed: COLONOSCOPY WITH PROPOFOL POLYPECTOMY, INTESTINE  Patient Location: PACU  Anesthesia Type:General  Level of Consciousness: sedated  Airway & Oxygen Therapy: Patient Spontanous Breathing  Post-op Assessment: Report given to RN and Post -op Vital signs reviewed and stable  Post vital signs: Reviewed and stable  Last Vitals:  Vitals Value Taken Time  BP 83/52 08/17/23 1434  Temp    Pulse 68 08/17/23 1434  Resp 18 08/17/23 1434  SpO2 96 % 08/17/23 1434    Last Pain:  Vitals:   08/17/23 1434  TempSrc:   PainSc: Asleep         Complications: No notable events documented.

## 2023-08-17 NOTE — Interval H&P Note (Signed)
 History and Physical Interval Note:  08/17/2023 2:11 PM  Hassell Done  has presented today for surgery, with the diagnosis of Z80.0 (ICD-10-CM) - Family history of colon cancer requiring screening colonoscopy.  The various methods of treatment have been discussed with the patient and family. After consideration of risks, benefits and other options for treatment, the patient has consented to  Procedure(s): COLONOSCOPY WITH PROPOFOL (N/A) as a surgical intervention.  The patient's history has been reviewed, patient examined, no change in status, stable for surgery.  I have reviewed the patient's chart and labs.  Questions were answered to the patient's satisfaction.     [place], [place]

## 2023-08-17 NOTE — Op Note (Signed)
 [facility] Patient Name: Loretta Wong Procedure Date: 08/17/2023 2:05 PM MRN: [id number] Account #: [id number] Date of Birth: 03-Jan-1951 Admit Type: Outpatient Age: 73 Room: [facility] ENDO ROOM 1 Gender: Female Note Status: Finalized Instrument Name: Prentice Docker [id number] Procedure:             Colonoscopy Indications:           Screening patient at increased risk: Family history of                         1st-degree relative with colorectal cancer at age 17                         years (or older) Providers:             Laina Guerrieri K. Norma Fredrickson MD, MD Referring MD:          Rhona Leavens. Burnett Sheng, MD (Referring MD) Medicines:             Propofol per Anesthesia Complications:         No immediate complications. Estimated blood loss: None. Procedure:             Pre-Anesthesia Assessment:                        - The risks and benefits of the procedure and the                         sedation options and risks were discussed with the                         patient. All questions were answered and informed                         consent was obtained.                        - Patient identification and proposed procedure were                         verified prior to the procedure by the nurse. The                         procedure was verified in the procedure room.                        - ASA Grade Assessment: III - A patient with severe                         systemic disease.                        - After reviewing the risks and benefits, the patient                         was deemed in satisfactory condition to undergo the                         procedure.  After obtaining informed consent, the colonoscope was                         passed under direct vision. Throughout the procedure,                         the patient's blood pressure, pulse, and oxygen                         saturations were monitored continuously. The                          Colonoscope was introduced through the anus and                         advanced to the the cecum, identified by appendiceal                         orifice and ileocecal valve. The colonoscopy was                         performed without difficulty. The patient tolerated                         the procedure well. The quality of the bowel                         preparation was good. The ileocecal valve, appendiceal                         orifice, and rectum were photographed. Findings:      The perianal and digital rectal examinations were normal. Pertinent       negatives include normal sphincter tone and no palpable rectal lesions.      Non-bleeding internal hemorrhoids were found during retroflexion. The       hemorrhoids were Grade I (internal hemorrhoids that do not prolapse).      Many medium-mouthed and small-mouthed diverticula were found in the       sigmoid colon and descending colon.      A 10 mm polyp was found in the ascending colon. The polyp was sessile.       The polyp was removed with a hot snare. Resection and retrieval were       complete. Estimated blood loss: none.      A 20 mm polyp was found in the sigmoid colon. The polyp was       pedunculated. The polyp was removed with a hot snare. Resection and       retrieval were complete. Estimated blood loss: none.      The exam was otherwise without abnormality. Impression:            - Non-bleeding internal hemorrhoids.                        - Diverticulosis in the sigmoid colon and in the                         descending colon.                        -  One 10 mm polyp in the ascending colon, removed with                         a hot snare. Resected and retrieved.                        - One 20 mm polyp in the sigmoid colon, removed with a                         hot snare. Resected and retrieved.                        - The examination was otherwise normal. Recommendation:        - Patient has  a contact number available for                         emergencies. The signs and symptoms of potential                         delayed complications were discussed with the patient.                         Return to normal activities tomorrow. Written                         discharge instructions were provided to the patient.                        - Resume previous diet.                        - Continue present medications.                        - Repeat colonoscopy is recommended for surveillance.                         The colonoscopy date will be determined after                         pathology results from today's exam become available                         for review.                        - Return to GI office PRN.                        - The findings and recommendations were discussed with                         the patient. Procedure Code(s):     --- Professional ---                        [phone], Colonoscopy, flexible; with removal of                         tumor(s), polyp(s), or other lesion(s) by snare  technique Diagnosis Code(s):     --- Professional ---                        K57.30, Diverticulosis of large intestine without                         perforation or abscess without bleeding                        D12.5, Benign neoplasm of sigmoid colon                        D12.2, Benign neoplasm of ascending colon                        K64.0, First degree hemorrhoids                        Z80.0, Family history of malignant neoplasm of                         digestive organs CPT copyright 2022 American [employer] Association. All rights reserved. The codes documented in this report are preliminary and upon coder review may  be revised to meet current compliance requirements. Stanton Kidney MD, MD 08/17/2023 2:36:30 PM This report has been signed electronically. Number of Addenda: 0 Note Initiated On: 08/17/2023 2:05 PM Scope Withdrawal  Time: 0 hours 9 minutes 37 seconds  Total Procedure Duration: 0 hours 13 minutes 43 seconds  Estimated Blood Loss:  Estimated blood loss: none.      [facility]

## 2023-08-17 NOTE — H&P (Signed)
 Outpatient short stay form Pre-procedure 08/17/2023 2:11 PM Alliah Boulanger K. Norma Fredrickson, M.D.  Primary Physician: Jerl Mina, M.D.  Reason for visit:  Family history of colon cancer (sister).  History of present illness:   73 year old patient presenting for family history of colon cancer. Patient denies any change in bowel habits, rectal bleeding or involuntary weight loss.     Current Facility-Administered Medications:    0.9 %  sodium chloride infusion, , Intravenous, Continuous, [place], Boykin Nearing, MD, Last Rate: 20 mL/hr at 08/17/23 1409, Continued from Pre-op at 08/17/23 1409  Medications Prior to Admission  Medication Sig Dispense Refill Last Dose/Taking   oxybutynin (DITROPAN-XL) 10 MG 24 hr tablet Take 1 tablet (10 mg total) by mouth daily. 90 tablet 3 08/13/2023   tamoxifen (NOLVADEX) 20 MG tablet Take by mouth.   08/16/2023   cetirizine (ZYRTEC) 10 MG tablet Take 10 mg by mouth daily as needed for allergies.      estradiol (ESTRACE) 0.1 MG/GM vaginal cream Estrogen Cream Instruction Discard applicator Apply pea sized amount to tip of finger to urethra before bed. Wash hands well after application. Use Monday, Wednesday and Friday 42.5 g 11    ibuprofen (ADVIL) 800 MG tablet Take 1 tablet (800 mg total) by mouth every 8 (eight) hours as needed for mild pain or moderate pain. 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    Physical Exam  Gen: Alert, oriented. Appears stated age.  HEENT: [place]/AT. PERRLA. Lungs: CTA, no wheezes. CV: RR nl  S1, S2. Abd: soft, benign, no masses. BS+ Ext: No edema. Pulses 2+    Planned procedures: Proceed with colonoscopy. The patient understands the nature of the planned procedure, indications, risks, alternatives and potential complications including but not limited to bleeding, infection, perforation, damage to internal organs and possible oversedation/side effects from anesthesia. The patient agrees and gives consent to proceed.  Please refer to procedure notes for findings, recommendations and patient disposition/instructions.     Abyan Cadman K. Norma Fredrickson, M.D. Gastroenterology 08/17/2023  2:11 PM

## 2023-08-18 ENCOUNTER — Encounter: Payer: Self-pay | Admitting: Internal Medicine

## 2023-08-18 LAB — SURGICAL PATHOLOGY

## 2023-08-18 NOTE — Anesthesia Postprocedure Evaluation (Signed)
 Anesthesia Post Note  Patient: Loretta Wong  Procedure(s) Performed: COLONOSCOPY WITH PROPOFOL POLYPECTOMY, INTESTINE  Patient location during evaluation: Endoscopy Anesthesia Type: General Level of consciousness: awake and alert Pain management: pain level controlled Vital Signs Assessment: post-procedure vital signs reviewed and stable Respiratory status: spontaneous breathing, nonlabored ventilation, respiratory function stable and patient connected to nasal cannula oxygen Cardiovascular status: blood pressure returned to baseline and stable Postop Assessment: no apparent nausea or vomiting Anesthetic complications: no   No notable events documented.   Last Vitals:  Vitals:   08/17/23 1444 08/17/23 1454  BP: 130/68 (!) 144/78  Pulse: 98 65  Resp: 20 16  Temp: (!) 35.7 C   SpO2: 99% 98%    Last Pain:  Vitals:   08/17/23 1454  TempSrc:   PainSc: 0-No pain                 Cleda Mccreedy [name]

## 2023-10-25 ENCOUNTER — Other Ambulatory Visit: Payer: Self-pay | Admitting: Urology

## 2024-01-13 ENCOUNTER — Ambulatory Visit: Admitting: Physician Assistant

## 2024-02-03 NOTE — Progress Notes (Signed)
 02/04/2024 6:31 PM   Loretta Wong [birth year] [id number]  Referring provider: Valora Agent, MD [address] [place],  [place] [address]  Urological history: 1. rUTI's -Contributing factors of age, GSM and constipation -Documented urine cultures over the last year -January 06, 2024 - MUF -October 29, 2023 - E.coli -February 19, 2023 - E.Coli -Vaginal estrogen cream and cranberry tablets  2. OAB -Contribute factors of age and GSM -Oxybutynin  XL 10 mg daily  Chief Complaint  Patient presents with   Follow-up   HPI: Loretta Wong is a 73 y.o. female who presents today for UTI.  Previous records reviewed.   Since April, she has had urgency,  suprapubic pressure and voiding little amounts.  She has been on four rounds of antibiotics without resolution of her symptoms.  She has recently returned from a trip to [place] and she feels fine.  She states her symptoms have abated and she has felt fine for the last week or so.  She was actually considering cancelling this appointment.    Patient denies any modifying or aggravating factors.  Patient denies any recent UTI's, gross hematuria, dysuria or suprapubic/flank pain.  Patient denies any fevers, chills, nausea or vomiting.    UA yellow clear, trace ketones, greater than 10 epithelial cells, mucus threads present moderate bacteria  She is using the vaginal estrogen cream.    She is taking the oxybutynin  XL 10 mg daily, but she is finding the constipation intolerable.    PMH: Past Medical History:  Diagnosis Date   Arthritis    Cancer (HCC)    Complication of anesthesia    GB SURGERY BP DOWN/KEPT OVERNIGHT   Hypertension    MONITORING   Malignant neoplasm of lower-outer quadrant of right female breast [facility])     Surgical History: Past Surgical History:  Procedure Laterality Date   BREAST BIOPSY Right 2002   neg   CATARACT EXTRACTION Bilateral    CHOLECYSTECTOMY     COLONOSCOPY WITH PROPOFOL  N/A  08/17/2023   Procedure: COLONOSCOPY WITH PROPOFOL ;  Surgeon: Toledo, Ladell POUR, MD;  Location: [facility] ENDOSCOPY;  Service: Gastroenterology;  Laterality: N/A;   EYE SURGERY     FRACTURE SURGERY     HERNIA REPAIR     MASTECTOMY     POLYPECTOMY  08/17/2023   Procedure: POLYPECTOMY, INTESTINE;  Surgeon: Aundria, Ladell POUR, MD;  Location: [facility] ENDOSCOPY;  Service: Gastroenterology;;   TONSILLECTOMY     VENTRAL HERNIA REPAIR N/A 12/15/2018   Procedure: OPEN HERNIA REPAIR VENTRAL ADULT WITH MESH;  Surgeon: Tye Millet, DO;  Location: [facility] ORS;  Service: General;  Laterality: N/A;    Home Medications:  Allergies as of 02/04/2024       Reactions   Oxybutynin  Other (See Comments)   Constipation   Sulfa Antibiotics Nausea And Vomiting, Other (See Comments)   Headaches, body aches   Neomycin Rash        Medication List        Accurate as of February 04, 2024 11:59 PM. If you have any questions, ask your nurse or doctor.          STOP taking these medications    oxybutynin  10 MG 24 hr tablet Commonly known as: DITROPAN -XL       TAKE these medications    amLODipine 5 MG tablet Commonly known as: NORVASC Take 5 mg by mouth.   cetirizine 10 MG tablet Commonly known as: ZYRTEC Take 10 mg by mouth daily  as needed for allergies.   estradiol  0.1 MG/GM vaginal cream Commonly known as: ESTRACE  APPLY PEA SIZED AMOUNT TO TIP OF FINGER AND APPLY TO URETHRA BEFORE BED. WASH HANDS WELL AFTER APPLICATION. USE MONDAY, WEDNESDAY AND FRIDAY   ibuprofen  800 MG tablet Commonly known as: ADVIL  Take 1 tablet (800 mg total) by mouth every 8 (eight) hours as needed for mild pain or moderate pain.   tamoxifen 20 MG tablet Commonly known as: NOLVADEX Take by mouth.        Allergies:  Allergies  Allergen Reactions   Oxybutynin  Other (See Comments)    Constipation   Sulfa Antibiotics Nausea And Vomiting and Other (See Comments)    Headaches, body aches   Neomycin Rash    Family  History: Family History  Problem Relation Age of Onset   Breast cancer Mother 36    Social History:  reports that she has never smoked. She has been exposed to tobacco smoke. She has never used smokeless tobacco. She reports current alcohol use. She reports that she does not use drugs.  ROS: Pertinent ROS in HPI  Physical Exam: BP (!) 161/81   Pulse 66   Ht 5' 6 (1.676 m)   Wt 200 lb (90.7 kg)   BMI 32.28 kg/m   Constitutional:  Well nourished. Alert and oriented, No acute distress. HEENT: [place] AT, moist mucus membranes.  Trachea midline Cardiovascular: No clubbing, cyanosis, or edema. Respiratory: Normal respiratory effort, no increased work of breathing. Neurologic: Grossly intact, no focal deficits, moving all 4 extremities. Psychiatric: Normal mood and affect.    Laboratory Data: See HPI and EPIC I have reviewed the labs.   Pertinent Imaging: N/A  Assessment & Plan:    1. rUTI's -Asymptomatic at today's visit -increase vaginal estrogen cream  to every night for 30 days and then 3 nights weekly and cranberry tablets  2. OAB -She is going to try oxybutynin  XL 5 mg to see if that controls her OAB symptoms without the constipation, she has some at home, she will notify me via MyChart regarding whether or not it was effective  Return for she will notify me via MyChart regarding the effectiveness of oxybutynin  .  These notes generated with voice recognition software. I apologize for typographical errors.  Loretta Wong  [facility] [address]  [address] [place], [place] [address] [phone]

## 2024-02-04 ENCOUNTER — Ambulatory Visit: Admitting: Urology

## 2024-02-04 VITALS — BP 161/81 | HR 66 | Ht 66.0 in | Wt 200.0 lb

## 2024-02-04 DIAGNOSIS — N3281 Overactive bladder: Secondary | ICD-10-CM | POA: Diagnosis not present

## 2024-02-04 DIAGNOSIS — N390 Urinary tract infection, site not specified: Secondary | ICD-10-CM

## 2024-02-04 LAB — URINALYSIS, COMPLETE
Bilirubin, UA: NEGATIVE
Glucose, UA: NEGATIVE
Leukocytes,UA: NEGATIVE
Nitrite, UA: NEGATIVE
Protein,UA: NEGATIVE
RBC, UA: NEGATIVE
Specific Gravity, UA: 1.03 (ref 1.005–1.030)
Urobilinogen, Ur: 0.2 mg/dL (ref 0.2–1.0)
pH, UA: 6 (ref 5.0–7.5)

## 2024-02-04 LAB — MICROSCOPIC EXAMINATION: Epithelial Cells (non renal): >10 /HPF — AB (ref 0–10)

## 2024-02-06 ENCOUNTER — Encounter: Payer: Self-pay | Admitting: Urology

## 2024-03-01 ENCOUNTER — Ambulatory Visit: Payer: Self-pay

## 2024-03-01 NOTE — Progress Notes (Signed)
 Loretta Wong is a 73 y.o. female presenting to clinic for possible UTI 1 2x days

## 2024-03-01 NOTE — Progress Notes (Addendum)
 Subjective Patient ID: Loretta Wong is a 73 y.o. female.    Loretta Wong is a 73 y.o. female who presents to the clinic for evaluation of increased urinary frequency onset x 2 days ago. Associated symptoms as listed per ROS. Denies any being currently sexually active. Notes that she has had 6 UTIs this year. States that she has appointment with urologist on 29 th of this month. Denies any prior hx of kidney infection.    History provided by:  Patient Language interpreter used: No     Review of Systems  Constitutional:  Negative for chills and fever.  Gastrointestinal:  Negative for abdominal pain, diarrhea, nausea and vomiting.  Genitourinary:  Positive for dysuria (mild), frequency and urgency. Negative for hematuria, vaginal bleeding and vaginal discharge.  Musculoskeletal:  Negative for back pain.  Skin:  Negative for rash.    Patient History  Allergies: Allergies  Allergen Reactions  . Sulfamethoxazole-Trimethoprim Other    Flu like symptoms  . Neomycin Rash  . Oxybutynin  Rash    Constipation  . Sulfa Antibiotics Nausea And Vomiting, Rash and Other    Headaches, body aches    History reviewed. No pertinent past medical history. History reviewed. No pertinent surgical history. Social History   Socioeconomic History  . Marital status: Not on file    Spouse name: Not on file  . Number of children: Not on file  . Years of education: Not on file  . Highest education level: Not on file  Occupational History  . Not on file  Tobacco Use  . Smoking status: Never  . Smokeless tobacco: Not on file  Substance and Sexual Activity  . Alcohol use: Not on file  . Drug use: Not on file  . Sexual activity: Not on file  Other Topics Concern  . Not on file  Social History Narrative  . Not on file   History reviewed. No pertinent family history. No current outpatient medications on file prior to visit.   No current facility-administered medications on file prior to  visit.    Objective  Vitals:   03/01/24 1857  BP: (!) 190/81  Pulse: 78  Resp: 19  Temp: 36.6 C (97.8 F)  TempSrc: Tympanic  SpO2: 96%  Weight: 94.3 kg  Height: 5' 6  PainSc: 0-No pain               No results found.  Physical Exam Vitals and nursing note reviewed.  Constitutional:      General: She is not in acute distress.    Appearance: Normal appearance. She is not ill-appearing, toxic-appearing or diaphoretic.  HENT:     Head: Normocephalic and atraumatic.  Eyes:     Conjunctiva/sclera: Conjunctivae normal.  Pulmonary:     Effort: Pulmonary effort is normal. No respiratory distress.  Musculoskeletal:     Cervical back: Normal range of motion and neck supple. No rigidity.  Skin:    General: Skin is dry.     Coloration: Skin is not jaundiced or pale.  Neurological:     Mental Status: She is alert.  Psychiatric:        Mood and Affect: Mood normal.        Behavior: Behavior normal.     Results for orders placed or performed in visit on 03/01/24  POCT urinalysis dipstick manually resulted  Component Result   Color, UA Yellow   Clarity, UA Cloudy (A)   Glucose, UA Negative   Bilirubin, UA Undetected   Ketones,  UA Negative   Spec Grav, UA 1.010   POCT Blood UA Large (A)   pH, UA 6.0   Protein, UA Negative   Urobilinogen, UA Negative   Leukocytes, UA 2+ (A)   Nitrite, UA Negative       Procedures MDM:     1 Acute complicated illness or injury     Explanation of Medical Decision Making and variances from expected care:  Urinalysis today shows 2+ leukocytes, blood.. There is clinical suspicion for a urinary tract infection based off history obtained, physical exam, and urinalysis findings.  Complicated UTI: Recurrent.  Urine culture has been ordered. Based off the urine culture resistance/sensitivity chart will change antibiotic choice if appropriate.     Assessment requiring historian other than patient: No     Independent visualization of  image, tracing, or test: No     Discussion of management with another provider: No     Risk:: Moderate          Assessment/Plan Diagnoses and all orders for this visit:  Urinary tract infection, recurrent -     POCT urinalysis dipstick manually resulted -     Urine Culture, Routine -     cephalexin  (Keflex ) 500 MG capsule; Take 1 capsule (500 mg total) by mouth in the morning and 1 capsule (500 mg total) at noon and 1 capsule (500 mg total) in the evening and 1 capsule (500 mg total) before bedtime. Do all this for 7 days.     Disposition Status: Home  Patient Instructions  Discharge Instructions - Urinary Tract Infection (UTI)   You have been diagnosed with a urinary tract infection (UTI). UTIs are common and occur when bacteria enter the urinary tract, often leading to symptoms like burning with urination, increased frequency or urgency, and pelvic discomfort. Follow the instructions below to help manage your symptoms and prevent future infections.  ?? Common Causes of UTIs in Females: Shorter Urethra The female urethra is much shorter than the female's, making it easier for bacteria to reach the bladder. Bacterial Contamination from the Perineal Area Most UTIs are caused by bacteria from the gastrointestinal (GI) tract, especially Escherichia coli (E. coli), which can migrate from the anus to the urethra. Sexual Activity Intercourse can introduce bacteria into the urinary tract (honeymoon cystitis). New sexual partners and increased frequency may increase the risk. Poor Wiping Technique Wiping back to front after urination or bowel movements can transfer bacteria to the urethral opening. Use of Spermicides and Diaphragms These contraceptives can disrupt normal vaginal flora and promote bacterial growth. Urinary Retention or Infrequent Voiding Holding urine for long periods can allow bacteria to multiply in the bladder. Pregnancy Hormonal changes and pressure on the bladder  can reduce urine flow and increase infection risk. Menopause Decreased estrogen levels can thin the vaginal and urethral lining and alter the normal flora, increasing susceptibility. Incomplete Bladder Emptying Conditions like diabetes, neurogenic bladder, or anatomical abnormalities may contribute to residual urine, promoting bacterial growth. Use of Catheters or Invasive Procedures Indwelling urinary catheters are a common source of infection (especially in hospitalized or elderly women).  Treatment Instructions: ?? Antibiotics: If an oral antibiotic was prescribed, start it as directed and complete the entire course, even if symptoms improve before finishing the medication. To reduce the risk of stomach upset or diarrhea, you may take over-the-counter probiotics or eat active-culture yogurt while on antibiotics.  Home Care & Prevention Tips: ?? Hydration: Drink plenty of fluids, especially water, to help flush out bacteria  from your urinary tract. ?? Diet: You may increase vitamin C intake to help acidify the urine and support your immune system. Consider drinking unsweetened cranberry juice, which may help reduce bacterial adherence to the bladder wall. Avoid alcohol and caffeine, as they can irritate the bladder. Limit acidic foods and drinks like orange juice and tomatoes, which may also worsen bladder irritation.  ?? Hygiene & Habits: Wipe from front to back after using the restroom to prevent the spread of bacteria. Urinate as often as needed--do not hold it in. Wash the genital area before and after sexual intercourse to reduce bacterial contamination. Avoid feminine sprays or douches, which can disrupt natural flora and increase infection risk.  Pain Relief: If you're experiencing abdominal or pelvic discomfort, you may use a heating pad over the lower abdomen for relief.  When to Seek Immediate Care: Go to the Emergency Department or contact your provider right away if you  develop: Worsening or persistent urinary symptoms Severe abdominal or back pain High fever, chills, or persistent nausea/vomiting Rapid heart rate Visible blood in the urine  Follow-Up: If your symptoms improve, continue with routine care and follow up with your primary care physician as needed.   Managing Gut Health While Taking Antibiotics  Taking antibiotics can disrupt the natural balance of bacteria in the gut, which may lead to antibiotic-associated diarrhea.  ?? Probiotics Can Help Several studies have shown that taking over-the-counter probiotics can reduce the risk of developing diarrhea while on antibiotics. However, since probiotics are made of live bacteria, they can also be affected or killed by antibiotics if taken at the same time. ?? To maximize effectiveness, take probiotics at least 2-3 hours apart from your antibiotic dose.  ?? Eat Fermented Foods Incorporating fermented foods into your diet can also help replenish healthy gut bacteria. These include: . Yogurt with active cultures . Aged cheese . Sauerkraut . Kimchi . Kombucha  ?? Include High-Fiber Foods High-fiber foods help feed and support the growth of good bacteria in the gut. Try to include: . Whole grains . Nuts and seeds . Lentils and legumes . Berries . Broccoli, peas, and bananas . Artichokes  ?? Avoid Alcohol Do not drink alcohol while taking antibiotics, as it can interfere with the medication and worsen side effects.  When taking new medication monitor for any allergy symptoms or adverse effects. True IgE mediated reactions occur within 25-30 minutes of taking a new medication.    If any symptoms of generalized hives/itching accompanied with shortness of breath, wheezing, chest pain, nausea/vomiting, rapid heart rate, low blood pressure, dizziness, or lip/tongue/throat/facial swelling patient is to call 911 or go to the ED for immediate care for severe allergy symptoms.   ?? Urgent Care  Visit Summary You have been evaluated and treated today in an urgent care setting. Please note that urgent care visits are not a substitute for ongoing care with your primary care provider (PCP).  If you have a PCP, we encourage you to keep any scheduled follow-up appointments or to contact their office to arrange appropriate follow-up care related to today's visit.  Thank you for choosing [facility]!  We appreciate the opportunity to care for you and wish you a speedy recovery.   Blood Pressure Counseling and Follow-Up Instructions  Your blood pressure was addressed during today's visit. The patient was advised to begin home monitoring and to keep a blood pressure log. If readings remain elevated, the patient should follow up with their primary care physician  for further evaluation and management.  Lifestyle Measures to Help Lower Blood Pressure: . Weight reduction, if overweight ?? Limit alcohol consumption ?? Regular aerobic exercise: Aim for 30 minutes per session, at least 3 times per week ?? Smoking cessation . Stress management: Techniques such as yoga or progressive muscle relaxation ??? Dietary modifications: . Reduce salt intake to < 3,000-4,000 mg/day . Limit saturated fat and cholesterol . Increase intake of fruits and vegetables  Home Monitoring: Use an over-the-counter blood pressure cuff for regular monitoring at home Record readings consistently and share them during follow-up visits  ?? Blood Pressure Goal: 120/80 mmHg or lower  ?? Seek Immediate Medical Attention If You Develop: . Severe shortness of breath . Severe headache . Speech difficulties . Weakness, numbness, or tingling in arms or legs . Double or blurry vision . Severe chest pain  ?? If any of these symptoms occur with high blood pressure, call 911 or go to the nearest emergency department immediately.     Progress note signed by Alm Prophet, PA on 03/01/24 at  7:34 PM

## 2024-03-06 NOTE — Telephone Encounter (Signed)
 Called patient about urine culture results. MDR organism noted. Needs likely change of antibiotics.

## 2024-03-07 NOTE — Progress Notes (Unsigned)
 03/08/2024 7:49 AM   Loretta Wong Jan 16, 1951 [id number]  Referring provider: Valora Lynwood FALCON, MD [address] [place],  [place] [address]  Urological history: 1. rUTI's -Contributing factors of age, GSM and constipation -March 01, 2024-E. coli -January 06, 2024 - MUF -October 29, 2023 - E.coli -February 19, 2023 - E.Coli -Vaginal estrogen cream and cranberry tablets  2. OAB -Contribute factors of age and GSM -Oxybutynin  XL 10 mg daily  Chief Complaint  Patient presents with   Urinary Frequency   HPI: Loretta Wong is a 73 y.o. female who presents today for urinary frequency.   Previous records reviewed  On October 22, she had noted an increase in urinary frequency.  She was getting up every 30 to minutes to an hour at night when her baseline is just usually once a night and then increased frequency during the day.  Urine dip noted a yellow cloudy urine with large heme and 2+ leukocytes.  She was started on Keflex .  Her urine culture grew out E. coli that was resistant to Keflex , so she was switched to doxycycline and she picked that prescription up this morning.  She is feeling better after starting the Keflex .  She will start the doxycycline tonight.  She did find that the oxybutynin  XL 5 mg daily was just as effective as the oxybutynin  XL 10 mg daily and it was with less side effects.  She is going 1-7 times during the day, 1-2 episodes of nocturia with a mild strong urge to urinate.  She sometimes will have stress incontinence and will wear 1 absorbent pad daily more so outside the house.  She does not limit fluid intake.  She does engage in toilet mapping.  Patient denies any modifying or aggravating factors.  Patient denies any recent UTI's, gross hematuria, dysuria or suprapubic/flank pain.  Patient denies any fevers, chills, nausea or vomiting.    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 [facility])     Surgical History: Past Surgical History:  Procedure Laterality Date   BREAST BIOPSY Right 2002   neg   CATARACT EXTRACTION Bilateral    CHOLECYSTECTOMY     COLONOSCOPY WITH PROPOFOL  N/A 08/17/2023   Procedure: COLONOSCOPY WITH PROPOFOL ;  Surgeon: Toledo, Ladell POUR, MD;  Location: [facility] ENDOSCOPY;  Service: Gastroenterology;  Laterality: N/A;   EYE SURGERY     FRACTURE SURGERY     HERNIA REPAIR     MASTECTOMY     POLYPECTOMY  08/17/2023   Procedure: POLYPECTOMY, INTESTINE;  Surgeon: Aundria, Ladell POUR, MD;  Location: [facility] ENDOSCOPY;  Service: Gastroenterology;;   TONSILLECTOMY     VENTRAL HERNIA REPAIR N/A 12/15/2018   Procedure: OPEN HERNIA REPAIR VENTRAL ADULT WITH MESH;  Surgeon: Tye Millet, DO;  Location: [facility] ORS;  Service: General;  Laterality: N/A;    Home Medications:  Allergies as of 03/08/2024       Reactions   Oxybutynin  Other (See Comments)   Constipation   Sulfa Antibiotics Nausea And Vomiting, Other (See Comments)   Headaches, body  aches   Neomycin Rash        Medication List        Accurate as of March 08, 2024 11:59 PM. If you have any questions, ask your nurse or doctor.          amLODipine 5 MG tablet Commonly known as: NORVASC Take 5 mg by mouth.   cetirizine 10 MG tablet Commonly known as: ZYRTEC Take 10 mg by mouth daily as needed for allergies.   estradiol  0.1 MG/GM vaginal cream Commonly known as: ESTRACE  APPLY PEA SIZED AMOUNT TO TIP OF FINGER AND APPLY TO URETHRA BEFORE BED. WASH HANDS WELL AFTER APPLICATION. USE MONDAY, WEDNESDAY AND FRIDAY   ibuprofen  800 MG tablet Commonly known as: ADVIL  Take  1 tablet (800 mg total) by mouth every 8 (eight) hours as needed for mild pain or moderate pain.   methenamine 1 g tablet Commonly known as: Hiprex Take 1 tablet (1 g total) by mouth 2 (two) times daily with a meal.   oxybutynin  5 MG tablet Commonly known as: DITROPAN  Take 1 tablet (5 mg total) by mouth daily.   tamoxifen 20 MG tablet Commonly known as: NOLVADEX Take by mouth.        Allergies:  Allergies  Allergen Reactions   Oxybutynin  Other (See Comments)    Constipation   Sulfa Antibiotics Nausea And Vomiting and Other (See Comments)    Headaches, body aches   Neomycin Rash    Family History: Family History  Problem Relation Age of Onset   Breast cancer Mother 77    Social History:  reports that she has never smoked. She has been exposed to tobacco smoke. She has never used smokeless tobacco. She reports current alcohol use. She reports that she does not use drugs.  ROS: Pertinent ROS in HPI  Physical Exam: BP (!) 172/85   Pulse 66   Ht 5' 6 (1.676 m)   Wt 203 lb (92.1 kg)   BMI 32.77 kg/m   Constitutional:  Well nourished. Alert and oriented, No acute distress. HEENT: [place] AT, moist mucus membranes.  Trachea midline Cardiovascular: No clubbing, cyanosis, or edema. Respiratory: Normal respiratory effort, no increased work of breathing. Neurologic: Grossly intact, no focal deficits, moving all 4 extremities. Psychiatric: Normal mood and affect.    Laboratory Data: See HPI and EPIC I have reviewed the labs.   Pertinent Imaging:  03/08/24 15:58  Scan Result 20ml    Assessment & Plan:    1. rUTI's -Currently on antibiotics for E. coli UTI -She is frustrated with the amount of UTIs she is having over the last year, so we will increase vaginal estrogen cream  to every night for 30 days and then 3 nights weekly and and start Hiprex 1 g twice daily to see if we can sterilize the urine and cut down on the amount of UTIs she has yearly - She is also a big  meat eater, so I discussed with her recent studies that meat may be contributing to urinary tract infections and she may want to consider reducing the amount of meats she consumes - She is going on a trip in December and is concerned she will have a UTI during that vacation, show she will come in a week prior so that we can check on her symptoms and look at a urine and hopefully prevent any UTIs on her trip  2. OAB - She is at goal on oxybutynin  XL 5 mg daily, prescription sent to [facility]  [place]   Return for the first week of December .  These notes generated with voice recognition software. I apologize for typographical errors.  CLOTILDA HELON RIGGERS  [facility] [address]  [address] [place], [place] [address] [phone]

## 2024-03-07 NOTE — Telephone Encounter (Signed)
 Called patient, went over urine culture results, stopped keflex , sent over doxycyclin based on culture

## 2024-03-08 ENCOUNTER — Ambulatory Visit (INDEPENDENT_AMBULATORY_CARE_PROVIDER_SITE_OTHER): Admitting: Urology

## 2024-03-08 VITALS — BP 172/85 | HR 66 | Ht 66.0 in | Wt 203.0 lb

## 2024-03-08 DIAGNOSIS — N3281 Overactive bladder: Secondary | ICD-10-CM

## 2024-03-08 DIAGNOSIS — N390 Urinary tract infection, site not specified: Secondary | ICD-10-CM

## 2024-03-08 LAB — BLADDER SCAN AMB NON-IMAGING

## 2024-03-08 MED ORDER — OXYBUTYNIN CHLORIDE 5 MG PO TABS: 5.0000 mg | ORAL_TABLET | Freq: Every day | ORAL | 3 refills | Status: AC

## 2024-03-08 MED ORDER — METHENAMINE HIPPURATE 1 G PO TABS: 1.0000 g | ORAL_TABLET | Freq: Two times a day (BID) | ORAL | 1 refills | Status: DC

## 2024-03-09 ENCOUNTER — Encounter: Payer: Self-pay | Admitting: Urology

## 2024-03-09 LAB — URINALYSIS, COMPLETE
Bilirubin, UA: NEGATIVE
Glucose, UA: NEGATIVE
Leukocytes,UA: NEGATIVE
Nitrite, UA: NEGATIVE
Protein,UA: NEGATIVE
RBC, UA: NEGATIVE
Specific Gravity, UA: 1.03 (ref 1.005–1.030)
Urobilinogen, Ur: 0.2 mg/dL (ref 0.2–1.0)
pH, UA: 5.5 (ref 5.0–7.5)

## 2024-03-09 LAB — MICROSCOPIC EXAMINATION: Epithelial Cells (non renal): >10 /HPF — AB (ref 0–10)

## 2024-03-10 ENCOUNTER — Other Ambulatory Visit: Payer: Self-pay | Admitting: Urology

## 2024-03-10 DIAGNOSIS — N390 Urinary tract infection, site not specified: Secondary | ICD-10-CM

## 2024-03-10 MED ORDER — METHENAMINE HIPPURATE 1 G PO TABS: 1.0000 g | ORAL_TABLET | Freq: Two times a day (BID) | ORAL | 0 refills | Status: AC

## 2024-03-22 NOTE — Progress Notes (Signed)
 Chief Complaint  Patient presents with  . Follow-up    UTI 2 weeks ago seen at [facility], treated with antibiotic, urine culture showed E-coli treated with Doxycycline  Urologist treated methenamine , broke out in rash Frequency urination returned today    Loretta Wong is a 73 y.o. female in clinic today for an acute visit.  HPI: History of Present Illness Patient was seen for a UTI at urgent care about 3 weeks ago. She was started on Keflex , urine culture showed that it wouldn't treat her infection so she was switched to Doxycycline. She saw urology last week and was started on a medication to help prevent UTIs but it caused a rash so she stopped taking it.   She developed dysuria, frequency, and urgency this morning. Her urine was also cloudy. She is unsure why she is getting so many UTIs. She is very conscious of the way she wipes and makes sure she uses good hygiene when wiping and going to the bathroom. She uses vaginal cream 3 days a week and is going to start using it everyday.   ROS: Review of Systems  Constitutional:  Negative for chills and fever.  Genitourinary:  Positive for dysuria, frequency and urgency. Negative for hematuria.     Allergies  Allergen Reactions  . Neomycin Rash  . Sulfa (Sulfonamide Antibiotics) Vomiting    Headaches, body aches    Past Medical History:  Diagnosis Date  . Anesthesia complication    Hypotension and desat requiring overnight O2 and monitoring after cholecystectomy 1980s - reports it took longer to put to sleep. Has not had issues with anesthesia since.  SABRA History of chicken pox   . Hypertension, poor control 05/19/2023  . Invasive ductal carcinoma of breast, female, right (CMS/HHS-HCC) 11/24/2019   11/24/2019: Right breast US -guided core needle biopsy of the mass in the lower outer quadrant (Q clip placed/verified) - invasive ductal carcinoma, grade 2, ER+/PR+/HER2-      Results for orders placed or performed in visit on 03/22/24  Urinalysis  w/Microscopic  Result Value Ref Range   Color Yellow Colorless, Straw, Light Yellow, Yellow, Dark Yellow   Clarity Cloudy (!) Clear   Specific Gravity 1.020 1.000 - 1.030   pH, Urine 6.0 5.0 - 8.0   Protein, Urinalysis 30 (!) Negative, Trace mg/dL   Glucose, Urinalysis Negative Negative mg/dL   Ketones, Urinalysis Negative Negative mg/dL   Blood, Urinalysis Large (!) Negative   Nitrite, Urinalysis Positive (!) Negative   Leukocyte Esterase, Urinalysis Large (!) Negative   White Blood Cells, Urinalysis >50 (!) None Seen, 0-3 /hpf   Red Blood Cells, Urinalysis >50 (!) None Seen, 0-3 /hpf   Bacteria, Urinalysis Moderate (!) None Seen /hpf   Squamous Epithelial Cells, Urinalysis Rare Rare, Few, None Seen /hpf    BP (!) 148/74 (BP Location: Left upper arm, Patient Position: Sitting, BP Cuff Size: Adult)   Pulse 75   Temp 36.9 C (98.4 F) (Temporal)   Ht 167.6 cm (5' 5.98)   Wt 93.5 kg (206 lb 3.2 oz)   SpO2 98%   BMI 33.30 kg/m    Physical Exam Constitutional:      Appearance: Normal appearance.  Neurological:     Mental Status: She is alert.    Assessment & Plan Recurrent UTI She was seen at the urgent care 3 weeks ago and was given Keflex . Urine culture was not susceptible to Keflex . Was switched to doxycyline. Finished the doxy 3 days ago. She saw urology last  week and was given methenamine  to help prevent UTIs. It caused her to break out into a rash so she stopped taking it. UA shows blood, leukocyte esterase, WBC, and bacteria. She has a follow-up with urology in 3.5 weeks. - Will send in Macrobid  BID. Last urine culture was susceptible to this antibiotic. Will send urine culture and change antibiotic as needed.  - Continue to drink plenty of water, avoid carbonated drinks, and continue good hygiene practices. - Follow up if symptoms worsen or do not improve.       Powell Snell, PA-C

## 2024-04-13 NOTE — Progress Notes (Unsigned)
 04/14/2024 9:30 PM   Loretta Wong [birth year] [id number]  Referring provider: Valora Lynwood FALCON, MD [address] [place],  [place] [address]  Urological history: 1. rUTI's -Contributing factors of age, GSM and constipation -March 22, 2024 - E.coli -March 01, 2024-E. coli -January 06, 2024 - MUF -October 29, 2023 - E.coli -Vaginal estrogen cream and cranberry tablets  2. OAB -Contribute factors of age and GSM -Oxybutynin  XL 10 mg daily  No chief complaint on file.  HPI: Loretta Wong is a 73 y.o. woman who presents today for follow up.    Previous records reviewed  Since her last visit with us , she was seen at her PCP's office after she broke out with a rash with Hiprex  and had an increase in urinary frequency.  UA w/ pyuria, hematuria and moderate bacteria.  Urine culture grew out MDRO E.coli.   They are having (1 to 7) or (8 or more) daytime voids,  they are having nocturia (1-2) or (3 or more) and urgency is (none, mild, strong, severe).   They are having (stress, urge or mixed incontinence.)    they are having urinary leakage (1-2 times weekly, 3 or more times weekly, 1-2 times daily and 3 or more times daily) They are using absorbent products for leakage (no, sometimes, always )   the type of products they use are (panty liners, absorbant pads, depends) *** daily.  They are not limiting fluids.  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 [facility])     Surgical History: Past Surgical History:  Procedure Laterality Date   BREAST BIOPSY Right 2002   neg   CATARACT EXTRACTION Bilateral    CHOLECYSTECTOMY      COLONOSCOPY WITH PROPOFOL  N/A 08/17/2023   Procedure: COLONOSCOPY WITH PROPOFOL ;  Surgeon: Toledo, Ladell POUR, MD;  Location: [facility] END[facility]COPY;  Service: Gastroenterology;  Laterality: N/A;   EYE SURGERY     FRACTURE SURGERY     HERNIA REPAIR     MASTECTOMY     POLYPECTOMY  08/17/2023   Procedure: POLYPECTOMY, INTESTINE;  Surgeon: Aundria, Ladell POUR, MD;  Location: [facility] ENDOSCOPY;  Service: Gastroenterology;;   TONSILLECTOMY     VENTRAL HERNIA REPAIR N/A 12/15/2018   Procedure: OPEN HERNIA REPAIR VENTRAL ADULT WITH MESH;  Surgeon: Tye Millet, DO;  Location: [facility] ORS;  Service: General;  Laterality: N/A;    Home Medications:  Allergies as of 04/14/2024       Reactions   Oxybutynin  Other (See Comments)   Constipation   Sulfa Antibiotics Nausea And Vomiting, Other (See Comments)   Headaches, body aches   Neomycin Rash        Medication List        Accurate as of April 13, 2024  9:30 PM. If you have any questions, ask your nurse or doctor.          amLODipine 5 MG tablet Commonly known as: NORVASC Take 5 mg by mouth.   cetirizine 10 MG tablet Commonly known as: ZYRTEC Take 10 mg by mouth daily as needed  for allergies.   estradiol  0.1 MG/GM vaginal cream Commonly known as: ESTRACE  APPLY PEA SIZED AMOUNT TO TIP OF FINGER AND APPLY TO URETHRA BEFORE BED. WASH HANDS WELL AFTER APPLICATION. USE MONDAY, WEDNESDAY AND FRIDAY   ibuprofen  800 MG tablet Commonly known as: ADVIL  Take 1 tablet (800 mg total) by mouth every 8 (eight) hours as needed for mild pain or moderate pain.   methenamine  1 g tablet Commonly known as: Hiprex  Take 1 tablet (1 g total) by mouth 2 (two) times daily with a meal.   oxybutynin  5 MG tablet Commonly known as: DITROPAN  Take 1 tablet (5 mg total) by mouth daily.   tamoxifen 20 MG tablet Commonly known as: NOLVADEX Take by mouth.        Allergies:  Allergies  Allergen Reactions   Oxybutynin  Other (See Comments)    Constipation    Sulfa Antibiotics Nausea And Vomiting and Other (See Comments)    Headaches, body aches   Neomycin Rash    Family History: Family History  Problem Relation Age of Onset   Breast cancer Mother 73    Social History:  reports that she has never smoked. She has been exposed to tobacco smoke. She has never used smokeless tobacco. She reports current alcohol use. She reports that she does not use drugs.  ROS: Pertinent ROS in HPI  Physical Exam: There were no vitals taken for this visit.  Constitutional:  Well nourished. Alert and oriented, No acute distress. HEENT: [place] AT, moist mucus membranes.  Trachea midline, no masses. Cardiovascular: No clubbing, cyanosis, or edema. Respiratory: Normal respiratory effort, no increased work of breathing. GU: No CVA tenderness.  No bladder fullness or masses.  Recession of labia minora, dry, pale vulvar vaginal mucosa and loss of mucosal ridges and folds.  Normal urethral meatus, no lesions, no prolapse, no discharge.   No urethral masses, tenderness and/or tenderness. No bladder fullness, tenderness or masses. *** vagina mucosa, *** estrogen effect, no discharge, no lesions, *** pelvic support, *** cystocele and *** rectocele noted.  No cervical motion tenderness.  Uterus is freely mobile and non-fixed.  No adnexal/parametria masses or tenderness noted.  Anus and perineum are without rashes or lesions.   ***  Neurologic: Grossly intact, no focal deficits, moving all 4 extremities. Psychiatric: Normal mood and affect.    Laboratory Data: See HPI and EPIC I have reviewed the labs.   Pertinent Imaging: N/A   Assessment & Plan:    1. rUTI's - criteria for recurrent UTI has been met with 2 or more infections in 6 months or 3 or greater infections in one year  - continue vaginal estrogen cream   - patient is instructed to increase their water intake until the urine is pale yellow or clear (10 to 12 cups daily) ***  - taking probiotics that include   Lactobacillus crispatus in premenopausal women and oral capsules with Lactobacillus rhamnosus GR-1 and Lactobacillus reuteri RC-14 in postmenopausal women *** - experienced a rash with Hiprex    - could consider D-mannose or cranberry products, but evidence is weak with contradictory findings ***  - address constipation issues ***                                   2. OAB - She is at goal on oxybutynin  XL 5 mg daily, prescription sent to [facility] pharmacy   No follow-ups on file.  These notes  generated with voice recognition software. I apologize for typographical errors.  Loretta Wong  [facility] [address]  [address] [place], [place] [address] [phone]

## 2024-04-14 ENCOUNTER — Ambulatory Visit: Admitting: Urology

## 2024-04-14 VITALS — BP 176/91 | HR 78 | Wt 197.0 lb

## 2024-04-14 DIAGNOSIS — N390 Urinary tract infection, site not specified: Secondary | ICD-10-CM

## 2024-04-14 DIAGNOSIS — N3281 Overactive bladder: Secondary | ICD-10-CM

## 2024-04-14 MED ORDER — FOSFOMYCIN TROMETHAMINE 3 G PO PACK: 3.0000 g | PACK | Freq: Once | ORAL | 0 refills | Status: AC

## 2024-04-14 NOTE — Patient Instructions (Addendum)
 Please Contact scheduling at [phone] to schedule Renal Ultrasound

## 2024-04-16 ENCOUNTER — Encounter: Payer: Self-pay | Admitting: Urology

## 2024-04-17 LAB — URINALYSIS, COMPLETE
Bilirubin, UA: NEGATIVE
Glucose, UA: NEGATIVE
Ketones, UA: NEGATIVE
Leukocytes,UA: NEGATIVE
Nitrite, UA: NEGATIVE
Protein,UA: NEGATIVE
RBC, UA: NEGATIVE
Specific Gravity, UA: 1.025 (ref 1.005–1.030)
Urobilinogen, Ur: 0.2 mg/dL (ref 0.2–1.0)
pH, UA: 6 (ref 5.0–7.5)

## 2024-04-17 LAB — MICROSCOPIC EXAMINATION

## 2024-04-18 LAB — CULTURE, URINE COMPREHENSIVE

## 2024-04-19 ENCOUNTER — Ambulatory Visit: Payer: Self-pay | Admitting: Urology

## 2024-04-21 ENCOUNTER — Ambulatory Visit: Admission: RE | Admit: 2024-04-21 | Discharge: 2024-04-21 | Attending: Urology

## 2024-04-21 DIAGNOSIS — N390 Urinary tract infection, site not specified: Secondary | ICD-10-CM
